# Patient Record
Sex: Female | Born: 1950 | Race: White | Hispanic: No | Smoking: Former smoker
Health system: Southern US, Community
[De-identification: ages and names within clinical notes are randomized; demographics above are authoritative.]

## PROBLEM LIST (undated history)

## (undated) DIAGNOSIS — J449 Chronic obstructive pulmonary disease, unspecified: Secondary | ICD-10-CM

## (undated) HISTORY — DX: Chronic obstructive pulmonary disease, unspecified: J44.9

## (undated) HISTORY — PX: TONSILLECTOMY: SUR1361

## (undated) HISTORY — PX: ABDOMINAL HYSTERECTOMY: SHX81

---

## 2021-06-19 LAB — CT OUTSIDE FILMS BODY

## 2021-06-24 ENCOUNTER — Encounter (INDEPENDENT_AMBULATORY_CARE_PROVIDER_SITE_OTHER): Payer: Self-pay | Admitting: *Deleted

## 2021-07-22 ENCOUNTER — Telehealth: Payer: Self-pay | Admitting: Internal Medicine

## 2021-07-22 NOTE — Telephone Encounter (Signed)
Patient scheduled pulmonary consult with Dr. Celine Mans 07/30/21. Papers were received and state Patient had cxr 07/14/21 and CTA 06/30/21 in [place], [place]. No images in PACS.   ATC patient as consult reminder and requested Patient contact location CT/cxr completed and bring CD with her to OV for Dr. Celine Mans to review.  LM to call back.

## 2021-07-24 NOTE — Telephone Encounter (Signed)
Labs requested from Alfonso Ramus, NP. My call was transferred to radiology for CD.  I requested chest CT and images are being transferred to [facility] PACS per April.

## 2021-07-24 NOTE — Telephone Encounter (Signed)
Called and spoke with Patient.  Patient stated she had CT with contrast of her lungs 06/30/21 in [place].  Patient stated she is seen by Alfonso Ramus, NP and Revonda Standard has copy of CD and records. I called the office of Alfonso Ramus, NP [place], [place] [phone].

## 2021-07-27 NOTE — Telephone Encounter (Signed)
CTA 06/30/21 has been uploaded and is available in PACS.

## 2021-07-30 ENCOUNTER — Encounter: Payer: Self-pay | Admitting: Internal Medicine

## 2021-07-30 ENCOUNTER — Other Ambulatory Visit: Payer: Self-pay

## 2021-07-30 ENCOUNTER — Ambulatory Visit (INDEPENDENT_AMBULATORY_CARE_PROVIDER_SITE_OTHER): Payer: Medicare Other | Admitting: Internal Medicine

## 2021-07-30 VITALS — BP 120/78 | HR 78 | Temp 98.5°F | Ht 65.0 in | Wt 153.0 lb

## 2021-07-30 DIAGNOSIS — R0602 Shortness of breath: Secondary | ICD-10-CM

## 2021-07-30 MED ORDER — ANORO ELLIPTA 62.5-25 MCG/ACT IN AEPB: 1.0000 | INHALATION_SPRAY | Freq: Every day | RESPIRATORY_TRACT | 11 refills | Status: DC

## 2021-07-30 MED ORDER — ALBUTEROL SULFATE HFA 108 (90 BASE) MCG/ACT IN AERS: 2.0000 | INHALATION_SPRAY | Freq: Four times a day (QID) | RESPIRATORY_TRACT | 5 refills | Status: DC | PRN

## 2021-07-30 NOTE — Progress Notes (Signed)
The patient has been prescribed the inhaler albuterol and anoro. Inhaler technique was demonstrated to patient. The patient subsequently demonstrated correct technique.

## 2021-07-30 NOTE — Progress Notes (Signed)
Rachael Mullins    [id number]    15-Jul-1951  Primary Care Physician:Stanfield, Bryson Ha, FNP  Referring Physician: Vanessa [place], FNP [address] [place],  [place] [address] Reason for Consultation: shortness of breath  Date of Consultation: 07/30/2021  Chief complaint:   Chief Complaint  Patient presents with   Consult    Patient reports she has some shortness of breath with exertion.      HPI: Had covid in the end of May. About 10 days later had worsening shortness of breath and went to the ED found to have multiple pulmonary emboli. Was admitted and started on eliquis. Symptoms of dyspnea improved. After three months she saw her doctor and was taken off eliquis. She has had persistent fatigue, no energy. She went back to her doctor and they did a repeat CT scan and saw that there was recurrent clot. She also had a contrast reaction. She was put back on eliquis.   Having lower extremity edema in the right lower extremity -  She had an ultrasound of lower extremity - negative for DVT.  She has seen heart and vascular doctor and they are doing an ischemic work up.   She feels that her breathing is much improved. She sleeps well at night. She does have some difficulty with taking steps and exerting herself.   She denies pain. Wheezing, coughing.    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  Exposures: lives at home independently, able to complete ADLs.  Smoking history: 50 pack years x 1/2 ppd = 25 pack years. Quit after covid.   Social History   Occupational History   Not on file  Tobacco Use   Smoking status: Former    Packs/day: 0.50    Types: Cigarettes    Start date: 09/13/1966    Quit date: 03/13/2021    Years since quitting: 0.3   Smokeless tobacco: Never  Substance and Sexual Activity   Alcohol use: Never   Drug use: Never   Sexual activity: Not Currently    Relevant family history:  Family  History  Problem Relation Age of Onset   Asthma Maternal Grandmother    Diabetes Maternal Grandmother    Asthma Paternal Grandmother    Alpha-1 antitrypsin deficiency Neg Hx    Tuberous sclerosis Neg Hx    Lung disease Neg Hx     History reviewed. No pertinent past medical history.  Past Surgical History:  Procedure Laterality Date   ABDOMINAL HYSTERECTOMY     TONSILLECTOMY       Physical Exam: Blood pressure 120/78, pulse 78, temperature 98.5 F (36.9 C), temperature source Oral, height 5\' 5"  (1.651 m), weight 153 lb (69.4 kg), SpO2 95 %. Gen:      No acute distress ENT:  no nasal polyps, mucus membranes moist Lungs:    diminished, No increased respiratory effort, symmetric chest wall excursion, clear to auscultation bilaterally, no wheezes or crackles CV:         Regular rate and rhythm; no murmurs, rubs, or gallops.  No pedal edema Abd:      + bowel sounds; soft, non-tender; no distension MSK: no acute synovitis of DIP or PIP joints, no mechanics hands.  Skin:      Warm and dry; no rashes Neuro: normal speech, no focal facial asymmetry Psych: alert and oriented x3, normal mood and affect   Data Reviewed/Medical Decision Making:  Independent interpretation of tests: Imaging: Unable to view CTPE study in PACS - will contact for study  PFTs:  No flowsheet data found.  Labs: CBC done 07/14/21 shows elevated WBC at 11, Hgb 13, no eosinophils Cr 0.9 K 3.8 Cl 101, CP2 30 Glucose 113 BNP 101 not elevated Total protein 5.9 Urine cx no growth to date.   Immunization status:   There is no immunization history on file for this patient.   I reviewed prior external note(s) from PCP, ED visits  I reviewed the result(s) of the labs and imaging as noted above.   I have ordered  PFT  Assessment:  Shortness of breath Covid 19 infection Acute PE, provoked in the setting of covid  Plan/Recommendations: Like new diagnosis COPD.  Start anoro 1 puff once a day. Start  albuterol prn. Will obtain PFTs. Continue eliquis for now. I would not think she needs more than 6 months of AC.this is likely still the provoked PE which is old and slowly resolving from Covid. I think her symptoms are more likely from smoking related lung disease - COPD.  I explained we do not routinely repeat CT scans for PE resolution. She has already had a contrast reaction.  Will obtain records from her previous imaging.   We discussed disease management and progression at length today.      Return to Care: Return in about 2 months (around 09/29/2021).  Durel Salts, MD Pulmonary and Critical Care Medicine [facility] [phone]  CC: Vanessa Pease, FNP

## 2021-07-30 NOTE — Patient Instructions (Addendum)
Please schedule follow up scheduled with myself in 2 months.    Before your next visit I would like you to have: Full set of PFTs - 1 hour, appointment with me after that  Start anoro inhaler 1 puff once a day.  Take the albuterol rescue inhaler every 4 to 6 hours as needed for wheezing or shortness of breath. You can also take it 15 minutes before exercise or exertional activity. Side effects include heart racing or pounding, jitters or anxiety. If you have a history of an irregular heart rhythm, it can make this worse. Can also give some patients a hard time sleeping.  To inhale the aerosol using an inhaler, follow these steps:  Remove the protective dust cap from the end of the mouthpiece. If the dust cap was not placed on the mouthpiece, check the mouthpiece for dirt or other objects. Be sure that the canister is fully and firmly inserted in the mouthpiece. 2. If you are using the inhaler for the first time or if you have not used the inhaler in more than 14 days, you will need to prime it. You may also need to prime the inhaler if it has been dropped. Ask your pharmacist or check the manufacturer's information if this happens. To prime the inhaler, shake it well and then press down on the canister 4 times to release 4 sprays into the air, away from your face. Be careful not to get albuterol in your eyes. 3. Shake the inhaler well. 4. Breathe out as completely as possible through your mouth. 4. Hold the canister with the mouthpiece on the bottom, facing you and the canister pointing upward. Place the open end of the mouthpiece into your mouth. Close your lips tightly around the mouthpiece. 6. Breathe in slowly and deeply through the mouthpiece.At the same time, press down once on the container to spray the medication into your mouth. 7. Try to hold your breath for 10 seconds. remove the inhaler, and breathe out slowly. 8. If you were told to use 2 puffs, wait 1 minute and then repeat steps  3-7. 9. Replace the protective cap on the inhaler. 10. Clean your inhaler regularly. Follow the manufacturer's directions carefully and ask your doctor or pharmacist if you have any questions about cleaning your inhaler.  Check the back of the inhaler to keep track of the total number of doses left on the inhaler.

## 2021-08-10 ENCOUNTER — Ambulatory Visit (INDEPENDENT_AMBULATORY_CARE_PROVIDER_SITE_OTHER): Payer: Medicare Other | Admitting: Gastroenterology

## 2021-08-10 ENCOUNTER — Other Ambulatory Visit: Payer: Self-pay

## 2021-08-10 ENCOUNTER — Encounter (INDEPENDENT_AMBULATORY_CARE_PROVIDER_SITE_OTHER): Payer: Self-pay | Admitting: Gastroenterology

## 2021-08-10 VITALS — BP 128/83 | HR 79 | Temp 99.3°F | Ht 65.0 in | Wt 156.8 lb

## 2021-08-10 DIAGNOSIS — K639 Disease of intestine, unspecified: Secondary | ICD-10-CM | POA: Diagnosis not present

## 2021-08-10 DIAGNOSIS — K862 Cyst of pancreas: Secondary | ICD-10-CM

## 2021-08-10 NOTE — Patient Instructions (Signed)
We will obtain most recent labs from Dr. Octaviano Glow, if he did not check your liver function, I will be in touch with you to update these.  We will get you scheduled for colonoscopy for further evaluation of the lesion in your colon.  Please obtain the CD with CT abdomen images that you had last month so that we can take a look at these and further evaluate the cyst that was seen on your pancreas, further recommendations to follow once these images are reviewed.

## 2021-08-10 NOTE — Progress Notes (Signed)
Referring Provider: Vanessa Sidney, FNP Primary Care Physician:  Vanessa [place], FNP Primary GI Physician: newly established  Chief Complaint  Patient presents with   cyst on pancreas    Pt arrives to discuss cyst on pancreas   HPI:   Rachael Mullins is a 70 y.o. female with past medical history of pulmonary Embolism on eliquis, vitamin D Deficiency.   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  Patient states that she went to the ER at the end of May with COVID, she went back a few weeks later with worsening SOB and was found to have PEs, she was started on Eliquis at that time, she was informed later on that she had an incidental finding of pancreatic cyst, she then saw her PCP and had repeat CT A/P for further evaluation of the cyst and was referred to GI for further evaluation.   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 There was also 67mm oval shaped hypodensity adjacent to the hepatic flexure of the colon, also unchanged from prior study. I am not able to review any images from this scan. Liver function WNL in June, she denies pruritus or jaundice. She had labs done with Dr. Octaviano Glow a few weeks ago, we will try to obtain these records.   She denies any history of pancreatitis, no issues with with nausea or vomiting, no diarrhea or abdominal pain. She has occasional constipation that she takes senokot as needed which seems to help, also eats a diet high in fruits and vegetables. She has had no changes in stool consistency or bowel habits. No rectal bleeding or melena.   Social hx: no etoh, previous smoker up until June. Fam hx:no CRC, liver disease  Last Colonoscopy:at age 56, 2003, Dr. Allena Katz, normal per patient Last Endoscopy:n/a  Recommendations:  Diagnostic colonoscopy for lesion of colon  History reviewed. No pertinent  past medical history.  Past Surgical History:  Procedure Laterality Date   ABDOMINAL HYSTERECTOMY     TONSILLECTOMY      Current Outpatient Medications  Medication Sig Dispense Refill   calcium carbonate (OSCAL) 1500 (600 Ca) MG TABS tablet Take 600 mg of elemental calcium by mouth 2 (two) times daily with a meal. With vit d3 400 iu     diclofenac (VOLTAREN) 50 MG EC tablet Take 50 mg by mouth 2 (two) times daily.     ELIQUIS DVT/PE STARTER PACK 5 mg See admin instructions. One bid     furosemide (LASIX) 20 MG tablet Take 20 mg by mouth daily.     OVER THE COUNTER MEDICATION Vit d 3 2,000 iu daily     zinc gluconate 50 MG tablet Take 50 mg by mouth daily.     albuterol (VENTOLIN HFA) 108 (90 Base) MCG/ACT inhaler Inhale 2 puffs into the lungs every 6 (six) hours as needed. 18 g 5   umeclidinium-vilanterol (ANORO ELLIPTA) 62.5-25 MCG/ACT AEPB Inhale 1 puff into the lungs daily. (Patient not taking: Reported on 08/10/2021) 1 each 11   No current facility-administered medications for this visit.    Allergies as of 08/10/2021 - Review Complete 08/10/2021  Allergen Reaction Noted   Ivp dye [iodinated diagnostic agents] Hives 07/30/2021   Codeine Nausea Only 07/30/2021   Nitrofurantoin Other (See Comments) 07/30/2021    Family History  Problem Relation Age of Onset   Asthma Maternal Grandmother  Diabetes Maternal Grandmother    Asthma Paternal Grandmother    Alpha-1 antitrypsin deficiency Neg Hx    Tuberous sclerosis Neg Hx    Lung disease Neg Hx     Social History   Socioeconomic History   Marital status: Widowed    Spouse name: Not on file   Number of children: Not on file   Years of education: Not on file   Highest education level: Not on file  Occupational History   Not on file  Tobacco Use   Smoking status: Former    Packs/day: 0.50    Types: Cigarettes    Start date: 09/13/1966    Quit date: 03/13/2021    Years since quitting: 0.4   Smokeless tobacco: Never   Substance and Sexual Activity   Alcohol use: Never   Drug use: Never   Sexual activity: Not Currently  Other Topics Concern   Not on file  Social History Narrative   Not on file   Social Determinants of Health   Financial Resource Strain: Not on file  Food Insecurity: Not on file  Transportation Needs: Not on file  Physical Activity: Not on file  Stress: Not on file  Social Connections: Not on file   Review of systems General: negative for malaise, night sweats, fever, chills, weight loss Neck: Negative for lumps, goiter, pain and significant neck swelling Resp: Negative for cough, wheezing, dyspnea at rest CV: Negative for chest pain, palpitations, orthopnea +non pitting edema to bilateral ankles, R worse than L. GI: denies melena, hematochezia, nausea, vomiting, diarrhea,  dysphagia, odyonophagia, early satiety or unintentional weight loss. +occasional constipation MSK: Negative for joint pain or swelling, back pain, and muscle pain. Derm: Negative for itching or rash Psych: Denies depression, anxiety, memory loss, confusion. No homicidal or suicidal ideation.  Heme: Negative for prolonged bleeding and swollen nodes. +easy bruising (on eliquis) Endocrine: Negative for cold or heat intolerance, polyuria, polydipsia and goiter. Neuro: negative for tremor, gait imbalance, syncope and seizures. The remainder of the review of systems is noncontributory.  Physical Exam: BP (!) 160/76 (BP Location: Right Arm, Patient Position: Sitting, Cuff Size: Large)   Pulse 79   Temp 99.3 F (37.4 C) (Oral)   Ht 5\' 5"  (1.651 m)   Wt 156 lb 12.8 oz (71.1 kg)   BMI 26.09 kg/m  General:   Alert and oriented. No distress noted. Pleasant and cooperative.  Head:  Normocephalic and atraumatic. Eyes:  Conjuctiva clear without scleral icterus. Mouth:  Oral mucosa pink and moist. Good dentition. No lesions. Heart: Normal rate and rhythm, s1 and s2 heart sounds present.  Lungs: Clear lung sounds  in all lobes. Respirations equal and unlabored. Abdomen:  +BS, soft, non-tender and non-distended. No rebound or guarding. No HSM or masses noted. Derm: No palmar erythema or jaundice Msk:  Symmetrical without gross deformities. Normal posture. Extremities:  Without edema. Neurologic:  Alert and  oriented x4 Psych:  Alert and cooperative. Normal mood and affect.  Invalid input(s): 6 MONTHS   ASSESSMENT: Rachael Mullins is a 70 y.o. female presenting today for further evaluation of pancreatic cyst noted on CT A/P.  5mm hypodense lesion within distal pancreatic tail, as noted in CT A/P report from 06/19/21. Patient has no hx of pancreatitis, no CBD dilation noted on imaging report. Last LFTs in June WNL, we will obtain most recent labs. Unable to review actual images from CT scan, patient will obtain CD with CT images for [place] to review, further recommendations to  follow.   There was also noted of 73mm oval-shaped hypodensity seen adjacent to hepatic flexure of colon on recent CT A/P in October, last colonoscopy was around 2003. Patient has had no changes in stool consistency or bowel habits, no rectal bleeding, weight loss or abdominal pain. We will schedule colonoscopy for further evaluation of lesion to ensure no underlying malignancy is present.   No red flag symptoms. Patient denies melena, hematochezia, nausea, vomiting, diarrhea, dysphagia, odyonophagia, early satiety or weight loss.   Indications, risks and benefits of procedure discussed in detail with patient. Patient verbalized understanding and is in agreement to proceed with colonoscopy at this time.   PLAN:  Patient to obtain CD with CT images for further review-further recommendations to follow after images reviewed 2. Will obtain labs from Dr. Scarlette Shorts, may need to check LFTs if these were not done 3. Schedule colonoscopy   Follow Up: TBD  Francie Keeling L. Alver Sorrow, MSN, APRN, AGNP-C Adult-Gerontology Nurse Practitioner [facility]

## 2021-08-11 ENCOUNTER — Telehealth (INDEPENDENT_AMBULATORY_CARE_PROVIDER_SITE_OTHER): Payer: Self-pay | Admitting: Gastroenterology

## 2021-08-12 NOTE — Telephone Encounter (Signed)
CT A/P images of pancreatic lesion were reviewed by Dr. Levon Hedger and radiologist on 08/11/21. Based on guidelines and imaging, as well as HPI, it was recommended that patient have MRCP W and W/O contrast two years from initial imaging (around 06/20/23) as lesion is likely a simple cyst. Patient has no weight loss, changes in appetite, nausea, vomiting, or new onset DM. I discussed this in depth with patient, all questions were answered. Patient verbalized understanding. She will let me know if she develops any new symptoms.

## 2021-08-14 ENCOUNTER — Encounter (INDEPENDENT_AMBULATORY_CARE_PROVIDER_SITE_OTHER): Payer: Self-pay

## 2021-09-18 ENCOUNTER — Encounter (INDEPENDENT_AMBULATORY_CARE_PROVIDER_SITE_OTHER): Payer: Self-pay

## 2021-10-08 ENCOUNTER — Encounter: Payer: Self-pay | Admitting: Internal Medicine

## 2021-10-08 ENCOUNTER — Ambulatory Visit: Payer: Medicare Other | Admitting: Internal Medicine

## 2021-10-08 ENCOUNTER — Ambulatory Visit (INDEPENDENT_AMBULATORY_CARE_PROVIDER_SITE_OTHER): Payer: Medicare Other | Admitting: Internal Medicine

## 2021-10-08 ENCOUNTER — Other Ambulatory Visit: Payer: Self-pay

## 2021-10-08 VITALS — BP 120/80 | HR 82 | Temp 97.7°F | Ht 64.0 in | Wt 157.6 lb

## 2021-10-08 DIAGNOSIS — J449 Chronic obstructive pulmonary disease, unspecified: Secondary | ICD-10-CM

## 2021-10-08 DIAGNOSIS — I2699 Other pulmonary embolism without acute cor pulmonale: Secondary | ICD-10-CM

## 2021-10-08 DIAGNOSIS — U071 COVID-19: Secondary | ICD-10-CM

## 2021-10-08 DIAGNOSIS — R0602 Shortness of breath: Secondary | ICD-10-CM | POA: Diagnosis not present

## 2021-10-08 LAB — PULMONARY FUNCTION TEST
DL/VA % pred: 102 %
DL/VA: 4.23 ml/min/mmHg/L
DLCO cor % pred: 90 %
DLCO cor: 17.5 ml/min/mmHg
DLCO unc % pred: 90 %
DLCO unc: 17.5 ml/min/mmHg
FEF 25-75 Post: 1.02 L/sec
FEF 25-75 Pre: 1.25 L/sec
FEF2575-%Change-Post: -18 %
FEF2575-%Pred-Post: 56 %
FEF2575-%Pred-Pre: 69 %
FEV1-%Change-Post: -4 %
FEV1-%Pred-Post: 88 %
FEV1-%Pred-Pre: 92 %
FEV1-Post: 1.96 L
FEV1-Pre: 2.05 L
FEV1FVC-%Change-Post: -2 %
FEV1FVC-%Pred-Pre: 91 %
FEV6-%Change-Post: -2 %
FEV6-%Pred-Post: 102 %
FEV6-%Pred-Pre: 104 %
FEV6-Post: 2.86 L
FEV6-Pre: 2.93 L
FEV6FVC-%Change-Post: 0 %
FEV6FVC-%Pred-Post: 103 %
FEV6FVC-%Pred-Pre: 103 %
FVC-%Change-Post: -2 %
FVC-%Pred-Post: 99 %
FVC-%Pred-Pre: 101 %
FVC-Post: 2.9 L
FVC-Pre: 2.97 L
Post FEV1/FVC ratio: 67 %
Post FEV6/FVC ratio: 99 %
Pre FEV1/FVC ratio: 69 %
Pre FEV6/FVC Ratio: 99 %
RV % pred: 133 %
RV: 2.97 L
TLC % pred: 115 %
TLC: 5.85 L

## 2021-10-08 NOTE — Progress Notes (Signed)
PFT done today. 

## 2021-10-08 NOTE — Patient Instructions (Addendum)
Please schedule follow up scheduled with myself in 12 months.  If my schedule is not open yet, we will contact you with a reminder closer to that time. Please call [phone] if you haven't heard from [place] a month before.   Keep taking albuterol. Ok to stop anoro.

## 2021-10-08 NOTE — Progress Notes (Signed)
Rachael Mullins    [id number]    05-12-1951  Primary Care Physician:Stanfield, Bryson Ha, FNP Date of Appointment: 10/08/2021 Established Patient Visit  Chief complaint:   Chief Complaint  Patient presents with   Follow-up    Patient reports that she is doing well with her breathing and short of breath with exertion at times,      HPI: Rachael Mullins is a 71 y.o. woman with past medical history of tobacco use disorder and covid infection with provoked PE.   Interval Updates: Feeling better with anoro. Not had any need for albuterol prn. She was wondering if she even needs the anoro due to not needing it much.   Activity level is better. She is working part time at radio station, going to [employer].   She feels she is gradually improving. Sleeping well. Eating well.   She is taking lasix for some leg swelling.   No prior history of PE. No family history of bleeding/clotting.   I have reviewed the patient's family social and past medical history and updated as appropriate.   Past Medical History:  Diagnosis Date   COPD (chronic obstructive pulmonary disease) (HCC)     Past Surgical History:  Procedure Laterality Date   ABDOMINAL HYSTERECTOMY     TONSILLECTOMY      Family History  Problem Relation Age of Onset   Asthma Maternal Grandmother    Diabetes Maternal Grandmother    Asthma Paternal Grandmother    Alpha-1 antitrypsin deficiency Neg Hx    Tuberous sclerosis Neg Hx    Lung disease Neg Hx     Social History   Occupational History   Not on file  Tobacco Use   Smoking status: Former    Packs/day: 0.50    Types: Cigarettes    Start date: 09/13/1966    Quit date: 03/13/2021    Years since quitting: 0.5   Smokeless tobacco: Never  Substance and Sexual Activity   Alcohol use: Never   Drug use: Never   Sexual activity: Not Currently     Physical Exam: Blood pressure 120/80, pulse 82, temperature 97.7 F (36.5 C), temperature source Oral,  height 5\' 4"  (1.626 m), weight 157 lb 9.6 oz (71.5 kg), SpO2 95 %.  Gen:      No acute distress ENT:  no nasal polyps, mucus membranes moist Lungs:    No increased respiratory effort, symmetric chest wall excursion, clear to auscultation bilaterally, no wheezes or crackles CV:         Regular rate and rhythm; no murmurs, rubs, or gallops.  No pedal edema   Data Reviewed: Imaging:  PFTs:  PFT Results Latest Ref Rng & Units 10/08/2021  FVC-Pre L 2.97  FVC-Predicted Pre % 101  FVC-Post L 2.90  FVC-Predicted Post % 99  Pre FEV1/FVC % % 69  Post FEV1/FCV % % 67  FEV1-Pre L 2.05  FEV1-Predicted Pre % 92  FEV1-Post L 1.96  DLCO uncorrected ml/min/mmHg 17.50  DLCO UNC% % 90  DLCO corrected ml/min/mmHg 17.50  DLCO COR %Predicted % 90  DLVA Predicted % 102  TLC L 5.85  TLC % Predicted % 115  RV % Predicted % 133   I have personally reviewed the patient's PFTs and very mild COPD FEV1 92% of predicted.   Labs:  Immunization status:  There is no immunization history on file for this patient.  External Records Personally Reviewed: cardiology  Assessment:  Mild COPD  FEV 1 92% of predicted Acute pulmonary embolism provoked from Covid 19 infection  Plan/Recommendations: Discussed risks and benefits at length. I recommend stopping Eliquis given this was likely provoked in the setting of covid infection and she has received over 6 months of AC.  Stop anoro, ok to continue prn albuterol.  Continue to abstain from smoking.    Return to Care: Return in about 1 year (around 10/08/2022).   Durel Salts, MD Pulmonary and Critical Care Medicine [facility] Office:[phone]

## 2021-10-19 NOTE — Progress Notes (Signed)
[place] [address] [place] [address]   CLINIC:  Medical Oncology/Hematology  CONSULT NOTE  Patient Care Team: Frances Maywood, FNP as PCP - General (Family Medicine)  CHIEF COMPLAINTS/PURPOSE OF CONSULTATION:  Pulmonary embolism  HISTORY OF PRESENTING ILLNESS:  Rachael Mullins 71 y.o. female is at the request of her primary care provider (NP Henry Russel) for evaluation of pulmonary embolism.  Per note sent by PCP, patient was diagnosed with bilateral pulmonary embolisms with associated right ventricular strain on 02/19/2021 thought to be provoked by COVID-19 and admission to [facility] in May 2022.  She took Eliquis for 3 months, and discontinued it in September 2022, as instructed by her hematologist at the time.  Patient did admit to not taking her Eliquis as prescribed, and for several weeks she only took the medication once a day rather than twice daily.  She was seen by her PCP on 06/30/2021 for dyspnea on exertion, and CTA chest at that time showed redemonstration of bilateral PEs.  Venous duplex was negative for DVTs.  At that time, she was resumed on Eliquis.  (She was briefly on Xarelto due to concern for possible reaction to Eliquis, but this was later felt to be a reaction to IV contrast rather than to Eliquis.  She has not had any issues with Eliquis after being restarted on it.)  She has continued to complain of dyspnea on exertion, and was referred to [facility] pulmonology, most recently seen by Dr. Shearon Stalls on 10/08/2021.  She has been diagnosed with COPD.  She is a former smoker.  Per note by Dr. Shearon Stalls, PE thought to be provoked by COVID-19 infection.  She is currently taking Eliquis as prescribed, 5 mg twice daily.  She has some easy bruising but denies any bleeding such as epistaxis, hematemesis, hematochezia, or melena.  She complains of bilateral ankle swelling, right greater than left, with recent vascular studies showing venous  insufficiency also right greater than left.  She continues to have some dyspnea on exertion, but this is getting better.  No shortness of breath at rest, chest pain, cough, hemoptysis, or palpitations.  She denies any previous DVT or PE before May 2022.  She has not had any miscarriages.  She denies any family history of DVT, PE, or coagulopathy.  No family history of miscarriages that she is aware of.  Her PMH is significant for mild COPD and venous insufficiency.  She is a former smoker, and smoked about 1/4 pack/day for 50 years, quit after her COVID infection in May 2022.  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  She denies any history of alcohol or illicit drug use.  Family history is unremarkable per her report.   MEDICAL HISTORY:  Past Medical History:  Diagnosis Date   COPD (chronic obstructive pulmonary disease) ([place])     SURGICAL HISTORY: Past Surgical History:  Procedure Laterality Date   ABDOMINAL HYSTERECTOMY     TONSILLECTOMY      SOCIAL HISTORY: Social History   Socioeconomic History   Marital status: Widowed    Spouse name: Not on file   Number of children: Not on file   Years of education: Not on file   Highest education level: Not on file  Occupational History   Not on file  Tobacco Use   Smoking status: Former    Packs/day: 0.50  Types: Cigarettes    Start date: 09/13/1966    Quit date: 03/13/2021    Years since quitting: 0.6   Smokeless tobacco: Never  Substance and Sexual Activity   Alcohol use: Never   Drug use: Never   Sexual activity: Not Currently  Other Topics Concern   Not on file  Social History Narrative   Not on file   Social Determinants of Health   Financial Resource Strain: Not on file  Food Insecurity: Not on file  Transportation Needs: Not on file  Physical Activity: Not on file  Stress: Not on file  Social Connections: Not on  file  Intimate Partner Violence: Not on file    FAMILY HISTORY: Family History  Problem Relation Age of Onset   Asthma Maternal Grandmother    Diabetes Maternal Grandmother    Asthma Paternal Grandmother    Alpha-1 antitrypsin deficiency Neg Hx    Tuberous sclerosis Neg Hx    Lung disease Neg Hx     ALLERGIES:  is allergic to ivp dye [iodinated contrast media], codeine, and nitrofurantoin.  MEDICATIONS:  Current Outpatient Medications  Medication Sig Dispense Refill   albuterol (VENTOLIN HFA) 108 (90 Base) MCG/ACT inhaler Inhale 2 puffs into the lungs every 6 (six) hours as needed. 18 g 5   diclofenac (VOLTAREN) 50 MG EC tablet Take 50 mg by mouth 2 (two) times daily.     ELIQUIS DVT/PE STARTER PACK 5 mg See admin instructions. One bid     furosemide (LASIX) 20 MG tablet Take 20 mg by mouth daily.     NON FORMULARY Take 2 tablets by mouth 2 (two) times daily. Takes Vit D, Zinc and Calcium together.     OVER THE COUNTER MEDICATION Vit d 3 2,000 iu daily     umeclidinium-vilanterol (ANORO ELLIPTA) 62.5-25 MCG/ACT AEPB Inhale 1 puff into the lungs daily. 1 each 11   No current facility-administered medications for this visit.    REVIEW OF SYSTEMS:   Review of Systems  Constitutional:  Positive for fatigue (mild, energy 70%). Negative for appetite change, chills, diaphoresis, fever and unexpected weight change.  HENT:   Negative for lump/mass and nosebleeds.   Eyes:  Negative for eye problems.  Respiratory:  Positive for shortness of breath (with exertion). Negative for cough and hemoptysis.   Cardiovascular:  Positive for leg swelling (ankles). Negative for chest pain and palpitations.  Gastrointestinal:  Negative for abdominal pain, blood in stool, constipation, diarrhea, nausea and vomiting.  Genitourinary:  Negative for hematuria.   Skin: Negative.   Neurological:  Negative for dizziness, headaches and light-headedness.  Hematological:  Does not bruise/bleed easily.      PHYSICAL EXAMINATION: ECOG PERFORMANCE STATUS: 1 - Symptomatic but completely ambulatory  There were no vitals filed for this visit. There were no vitals filed for this visit.  Physical Exam Constitutional:      Appearance: Normal appearance.  HENT:     Head: Normocephalic and atraumatic.     Mouth/Throat:     Mouth: Mucous membranes are moist.  Eyes:     Extraocular Movements: Extraocular movements intact.     Pupils: Pupils are equal, round, and reactive to light.  Cardiovascular:     Rate and Rhythm: Normal rate and regular rhythm.     Pulses: Normal pulses.     Heart sounds: Normal heart sounds.  Pulmonary:     Effort: Pulmonary effort is normal.     Breath sounds: Normal breath sounds.  Abdominal:  General: Bowel sounds are normal.     Palpations: Abdomen is soft.     Tenderness: There is no abdominal tenderness.  Musculoskeletal:        General: No swelling.     Right lower leg: Edema (1+ ankle edema) present.     Left lower leg: Edema (trace ankle edema) present.  Lymphadenopathy:     Cervical: No cervical adenopathy.  Skin:    General: Skin is warm and dry.  Neurological:     General: No focal deficit present.     Mental Status: She is alert and oriented to person, place, and time.  Psychiatric:        Mood and Affect: Mood normal.        Behavior: Behavior normal.      LABORATORY DATA:  I have reviewed the data as listed Recent Results (from the past 2160 hour(s))  Pulmonary function test     Status: None   Collection Time: 10/08/21 12:54 PM  Result Value Ref Range   FVC-Pre 2.97 L   FVC-%Pred-Pre 101 %   FVC-Post 2.90 L   FVC-%Pred-Post 99 %   FVC-%Change-Post -2 %   FEV1-Pre 2.05 L   FEV1-%Pred-Pre 92 %   FEV1-Post 1.96 L   FEV1-%Pred-Post 88 %   FEV1-%Change-Post -4 %   FEV6-Pre 2.93 L   FEV6-%Pred-Pre 104 %   FEV6-Post 2.86 L   FEV6-%Pred-Post 102 %   FEV6-%Change-Post -2 %   Pre FEV1/FVC ratio 69 %   FEV1FVC-%Pred-Pre 91 %    Post FEV1/FVC ratio 67 %   FEV1FVC-%Change-Post -2 %   Pre FEV6/FVC Ratio 99 %   FEV6FVC-%Pred-Pre 103 %   Post FEV6/FVC ratio 99 %   FEV6FVC-%Pred-Post 103 %   FEV6FVC-%Change-Post 0 %   FEF 25-75 Pre 1.25 L/sec   FEF2575-%Pred-Pre 69 %   FEF 25-75 Post 1.02 L/sec   FEF2575-%Pred-Post 56 %   FEF2575-%Change-Post -18 %   RV 2.97 L   RV % pred 133 %   TLC 5.85 L   TLC % pred 115 %   DLCO unc 17.50 ml/min/mmHg   DLCO unc % pred 90 %   DLCO cor 17.50 ml/min/mmHg   DLCO cor % pred 90 %   DL/VA 4.23 ml/min/mmHg/L   DL/VA % pred 102 %    RADIOGRAPHIC STUDIES: I have personally reviewed the radiological images as listed and agreed with the findings in the report. No results found.  ASSESSMENT & PLAN: 1.  Pulmonary embolism, bilateral - Seen at the request of her primary care provider (NP Henry Russel) for evaluation of pulmonary embolism. - Patient was diagnosed with bilateral PEs and associated right ventricular heart strain on 02/19/2021, thought to be provoked by COVID-19 (May 2022); admitted to [facility] in May 2022. - She took Eliquis for 3 months, and discontinued it in September 2022, but admitted to not taking her Eliquis as prescribed - for several weeks she only took the medication once a day rather than twice daily. - She was seen by her PCP on 06/30/2021 for DOE, and CTA chest at that time showed redemonstration of bilateral PEs in the same distribution area as original PEs.  Venous duplex was negative for DVTs.  At that time, she was resumed on Eliquis. - She was briefly on Xarelto due to concern for possible reaction to Eliquis, but this was later felt to be a reaction to IV contrast rather than to Eliquis.  She has not had any  issues with Eliquis after being restarted on it. - She is taking Eliquis as prescribed.  She has not had any major bleeding events. - She has some mild residual dyspnea on exertion - No previous DVT or PE.  No personal or family history  of miscarriages.  No family history of coagulopathy. - Since CTA on 06/30/2021 showed redistribution of PE in same pattern as first PE, differential diagnosis favors residual rather than recurrent PE.  Thus, it is important that we ensure PE has fully resolved before taking patient off of Eliquis. - PLAN: Patient instructed to continue Eliquis 5 mg twice daily. - We will check CTA chest (with steroid + Benadryl premedication due to possible allergy to iodine contrast) - We will check preliminary coagulopathy panel today. - RTC in 1 month to discuss results and next steps  2.  Other history - PMH: COPD, venous insufficiency - SOCIAL: Former smoker (0.25 PPD x50 years).  No alcohol or illicit drugs.  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 - FAMILY: Unremarkable per patient report.   All questions were answered. The patient knows to call the clinic with any problems, questions or concerns.   Medical decision making: Moderate  Time spent on visit: I spent 25 minutes counseling the patient face to face. The total time spent in the appointment was 40 minutes and more than 50% was on counseling.  I, Tarri Abernethy PA-C, have seen this patient in conjunction with Dr. Derek Jack.  Greater than 50% of visit was performed by Dr. Delton Coombes.    Harriett Rush, PA-C 10/20/2021 5:50 PM  DR.  Mauri Temkin: I have independently evaluated this patient and formulated my assessment and plan.  I agree with HPI and assessment plan written by Casey Burkitt, PA-C.  She had initial bilateral PE on 02/19/2021 provoked by COVID-19.  She took Eliquis for 3 months and stopped it.  CT angiogram on 06/30/2021 showed pulmonary embolism in similar distribution.  She was started back on Eliquis without any major problems.  I think the second episode was not a new pulmonary embolism, but suboptimally treated first episode on 02/19/2021.  Hence I believe that limited duration anticoagulation is sufficient  in this patient.  However we would like to have a negative CT angiogram and normal D-dimer prior to discontinuation of anticoagulation at this time.

## 2021-10-20 ENCOUNTER — Encounter (HOSPITAL_COMMUNITY): Payer: Self-pay | Admitting: Hematology

## 2021-10-20 ENCOUNTER — Inpatient Hospital Stay (HOSPITAL_COMMUNITY): Payer: Medicare Other | Attending: Hematology | Admitting: Hematology

## 2021-10-20 ENCOUNTER — Other Ambulatory Visit: Payer: Self-pay

## 2021-10-20 ENCOUNTER — Inpatient Hospital Stay (HOSPITAL_COMMUNITY): Payer: Medicare Other

## 2021-10-20 VITALS — BP 132/84 | HR 72 | Temp 98.6°F | Resp 18 | Ht 64.0 in | Wt 156.6 lb

## 2021-10-20 DIAGNOSIS — Z7901 Long term (current) use of anticoagulants: Secondary | ICD-10-CM

## 2021-10-20 DIAGNOSIS — Z86711 Personal history of pulmonary embolism: Secondary | ICD-10-CM

## 2021-10-20 DIAGNOSIS — J449 Chronic obstructive pulmonary disease, unspecified: Secondary | ICD-10-CM | POA: Diagnosis not present

## 2021-10-20 DIAGNOSIS — I2699 Other pulmonary embolism without acute cor pulmonale: Secondary | ICD-10-CM

## 2021-10-20 DIAGNOSIS — Z86718 Personal history of other venous thrombosis and embolism: Secondary | ICD-10-CM

## 2021-10-20 DIAGNOSIS — Z87891 Personal history of nicotine dependence: Secondary | ICD-10-CM

## 2021-10-20 LAB — CBC WITH DIFFERENTIAL/PLATELET
Abs Immature Granulocytes: 0.04 10*3/uL (ref 0.00–0.07)
Basophils Absolute: 0.1 10*3/uL (ref 0.0–0.1)
Basophils Relative: 1 %
Eosinophils Absolute: 0.2 10*3/uL (ref 0.0–0.5)
Eosinophils Relative: 3 %
HCT: 42.5 % (ref 36.0–46.0)
Hemoglobin: 13.7 g/dL (ref 12.0–15.0)
Immature Granulocytes: 1 %
Lymphocytes Relative: 28 %
Lymphs Abs: 2.5 10*3/uL (ref 0.7–4.0)
MCH: 31.5 pg (ref 26.0–34.0)
MCHC: 32.2 g/dL (ref 30.0–36.0)
MCV: 97.7 fL (ref 80.0–100.0)
Monocytes Absolute: 0.7 10*3/uL (ref 0.1–1.0)
Monocytes Relative: 8 %
Neutro Abs: 5.3 10*3/uL (ref 1.7–7.7)
Neutrophils Relative %: 59 %
Platelets: 300 10*3/uL (ref 150–400)
RBC: 4.35 MIL/uL (ref 3.87–5.11)
RDW: 13.2 % (ref 11.5–15.5)
WBC: 8.9 10*3/uL (ref 4.0–10.5)
nRBC: 0 % (ref 0.0–0.2)

## 2021-10-20 LAB — D-DIMER, QUANTITATIVE: D-Dimer, Quant: 0.4 ug/mL-FEU (ref 0.00–0.50)

## 2021-10-20 MED ORDER — PREDNISONE 50 MG PO TABS: 50.0000 mg | ORAL_TABLET | ORAL | 0 refills | Status: DC

## 2021-10-20 MED ORDER — DIPHENHYDRAMINE HCL 50 MG PO TABS: 50.0000 mg | ORAL_TABLET | ORAL | 0 refills | Status: DC

## 2021-10-20 NOTE — Patient Instructions (Addendum)
[facility] at [facility] Discharge Instructions  You were seen today by Dr. Ellin Saba & Rojelio Brenner PA-C for your pulmonary embolism.  As we discussed, this was most likely caused by your COVID-19 infection in May 2022.  However, before taking you off of Eliquis, we want to make sure it is safe for you to do so.  We will check repeat CTA of your chest to see if the blood clot has resolved.  Because of your possible allergy to iodine contrast, you will need to take the following before the CT scan: - Prednisone 50 mg 13 hours before CT scan - Prednisone 50 mg 7 hours before CT scan - Prednisone 50 mg 1 hour before CT scan - Benadryl (diphenhydramine) 50 mg 1 hour before CT scan  We will also check blood tests today to see if you have any underlying blood clotting disorder.  Please be aware that some of these tests can be skewed by the fact that you are on Eliquis, so do not be alarmed if they show up as positive.  If any of these tests are positive, we will repeat them after you have stopped Eliquis in the future.  LABS: Check labs today before leaving the hospital building  OTHER TESTS: CTA chest  MEDICATIONS: Continue Eliquis 5 mg twice daily  FOLLOW-UP APPOINTMENT: Office visit in 1 month, after testing has been completed   Thank you for choosing [facility] at [facility] to provide your oncology and hematology care.  To afford each patient quality time with our provider, please arrive at least 15 minutes before your scheduled appointment time.   If you have a lab appointment with the Cancer Center please come in thru the Main Entrance and check in at the main information desk.  You need to re-schedule your appointment should you arrive 10 or more minutes late.  We strive to give you quality time with our providers, and arriving late affects you and other patients whose appointments are after yours.  Also, if you no show three or  more times for appointments you may be dismissed from the clinic at the providers discretion.     Again, thank you for choosing [facility].  Our hope is that these requests will decrease the amount of time that you wait before being seen by our physicians.       _____________________________________________________________  Should you have questions after your visit to [facility], please contact our office at [phone] and follow the prompts.  Our office hours are 8:00 a.m. and 4:30 p.m. Monday - Friday.  Please note that voicemails left after 4:00 p.m. may not be returned until the following business day.  We are closed weekends and major holidays.  You do have access to a nurse 24-7, just call the main number to the clinic [phone] and do not press any options, hold on the line and a nurse will answer the phone.    For prescription refill requests, have your pharmacy contact our office and allow 72 hours.    Due to Covid, you will need to wear a mask upon entering the hospital. If you do not have a mask, a mask will be given to you at the Main Entrance upon arrival. For doctor visits, patients may have 1 support person age 19 or older with them. For treatment visits, patients can not have anyone with them due to social distancing guidelines and our  immunocompromised population.

## 2021-10-22 LAB — BETA-2-GLYCOPROTEIN I ABS, IGG/M/A
Beta-2 Glyco I IgG: <9 GPI IgG units (ref 0–20)
Beta-2-Glycoprotein I IgA: <9 GPI IgA units (ref 0–25)
Beta-2-Glycoprotein I IgM: <9 GPI IgM units (ref 0–32)

## 2021-10-22 LAB — CARDIOLIPIN ANTIBODIES, IGG, IGM, IGA
Anticardiolipin IgA: <9 APL U/mL (ref 0–11)
Anticardiolipin IgG: <9 GPL U/mL (ref 0–14)
Anticardiolipin IgM: <9 MPL U/mL (ref 0–12)

## 2021-10-23 LAB — FACTOR 5 LEIDEN

## 2021-10-26 LAB — PROTHROMBIN GENE MUTATION

## 2021-11-24 ENCOUNTER — Other Ambulatory Visit: Payer: Self-pay

## 2021-11-24 ENCOUNTER — Encounter (HOSPITAL_COMMUNITY): Payer: Self-pay | Admitting: Radiology

## 2021-11-24 ENCOUNTER — Ambulatory Visit (HOSPITAL_COMMUNITY)
Admission: RE | Admit: 2021-11-24 | Discharge: 2021-11-24 | Disposition: A | Discharge status: Home / Self Care | Payer: Medicare Other | Source: Ambulatory Visit | Attending: Physician Assistant | Admitting: Physician Assistant

## 2021-11-24 DIAGNOSIS — I2699 Other pulmonary embolism without acute cor pulmonale: Secondary | ICD-10-CM | POA: Insufficient documentation

## 2021-11-24 DIAGNOSIS — Z86711 Personal history of pulmonary embolism: Secondary | ICD-10-CM | POA: Diagnosis present

## 2021-11-24 DIAGNOSIS — Z86718 Personal history of other venous thrombosis and embolism: Secondary | ICD-10-CM | POA: Insufficient documentation

## 2021-11-24 LAB — POCT I-STAT CREATININE: Creatinine, Ser: 0.9 mg/dL (ref 0.44–1.00)

## 2021-11-24 MED ORDER — IOHEXOL 350 MG/ML SOLN
100.0000 mL | Freq: Once | INTRAVENOUS | Status: AC | PRN
  Administered 2021-11-24: 75 mL via INTRAVENOUS

## 2021-11-26 ENCOUNTER — Ambulatory Visit (HOSPITAL_COMMUNITY): Payer: Medicare Other | Admitting: Hematology

## 2021-11-26 ENCOUNTER — Inpatient Hospital Stay (HOSPITAL_COMMUNITY): Payer: Medicare Other | Attending: Hematology | Admitting: Physician Assistant

## 2021-11-26 ENCOUNTER — Other Ambulatory Visit: Payer: Self-pay

## 2021-11-26 VITALS — BP 150/82 | HR 68 | Temp 98.0°F | Resp 18 | Ht 65.0 in | Wt 161.8 lb

## 2021-11-26 DIAGNOSIS — Z87891 Personal history of nicotine dependence: Secondary | ICD-10-CM | POA: Insufficient documentation

## 2021-11-26 DIAGNOSIS — I2699 Other pulmonary embolism without acute cor pulmonale: Secondary | ICD-10-CM | POA: Diagnosis not present

## 2021-11-26 DIAGNOSIS — Z7952 Long term (current) use of systemic steroids: Secondary | ICD-10-CM | POA: Diagnosis not present

## 2021-11-26 DIAGNOSIS — Z86718 Personal history of other venous thrombosis and embolism: Secondary | ICD-10-CM | POA: Diagnosis not present

## 2021-11-26 DIAGNOSIS — Z86711 Personal history of pulmonary embolism: Secondary | ICD-10-CM | POA: Insufficient documentation

## 2021-11-26 DIAGNOSIS — Z79899 Other long term (current) drug therapy: Secondary | ICD-10-CM | POA: Diagnosis not present

## 2021-11-26 DIAGNOSIS — Z7901 Long term (current) use of anticoagulants: Secondary | ICD-10-CM | POA: Insufficient documentation

## 2021-11-26 NOTE — Patient Instructions (Signed)
[facility] at [facility] ?Discharge Instructions ? ?You were seen today by Rojelio Brenner PA-C for your history of blood clots.  Your most recent CT scan showed that your blood clots have resolved.  Your blood tests did not show any underlying conditions that would have caused a blood clot. ? ?You can now STOP taking your Eliquis. ? ?We will check a few other labs in about 6 weeks, since these labs could not be checked while you were on Eliquis.  These tests will tell us if you have any other blood conditions that would cause you to have blood clots.  ? ?We will schedule you for a phone visit to discuss these results. ?If these next blood tests are normal, you will not need to see [place] in the office again. ?HOWEVER, if you have any blood clots again in the future, you will have to come back to see [place] again. ? ?Please READ CAREFULLY through the attached handouts, especially regarding symptoms of recurrent blood clots.  If these symptoms occur, you should seek IMMEDIATE MEDICAL attention. ? ?LABS: Return in 6 weeks for repeat labs. ? ?MEDICATIONS: You can STOP Eliquis ? ?FOLLOW-UP APPOINTMENT: Phone visit in 8 weeks ? ? ?Thank you for choosing [facility] at [facility] to provide your oncology and hematology care.  To afford each patient quality time with our provider, please arrive at least 15 minutes before your scheduled appointment time.  ? ?If you have a lab appointment with the Cancer Center please come in thru the Main Entrance and check in at the main information desk. ? ?You need to re-schedule your appointment should you arrive 10 or more minutes late.  We strive to give you quality time with our providers, and arriving late affects you and other patients whose appointments are after yours.  Also, if you no show three or more times for appointments you may be dismissed from the clinic at the providers discretion.     ?Again, thank you for choosing [facility].  Our hope is that these requests will decrease the amount of time that you wait before being seen by our physicians.       ?_____________________________________________________________ ? ?Should you have questions after your visit to [facility], please contact our office at [phone] and follow the prompts.  Our office hours are 8:00 a.m. and 4:30 p.m. Monday - Friday.  Please note that voicemails left after 4:00 p.m. may not be returned until the following business day.  We are closed weekends and major holidays.  You do have access to a nurse 24-7, just call the main number to the clinic [phone] and do not press any options, hold on the line and a nurse will answer the phone.   ? ?For prescription refill requests, have your pharmacy contact our office and allow 72 hours.   ? ?Due to Covid, you will need to wear a mask upon entering the hospital. If you do not have a mask, a mask will be given to you at the Main Entrance upon arrival. For doctor visits, patients may have 1 support person age 66 or older with them. For treatment visits, patients can not have anyone with them due to social distancing guidelines and our immunocompromised population.  ? ? ? ?

## 2021-11-26 NOTE — Progress Notes (Signed)
? ?[facility] ?[address] ?[place], [place] [address] ? ? ?CLINIC:  ?Medical Oncology/Hematology ? ?PCP:  ?Rachael Maywood, FNP ?[place] ?[place] [id number] ?[phone] ? ? ?REASON FOR VISIT:  ?Follow-up for pulmonary embolism ? ?CURRENT THERAPY: Eliquis 5 mg twice daily ? ?INTERVAL HISTORY: Fair ?Rachael Mullins 71 y.o. female returns for routine follow-up of her pulmonary embolism.  She was seen by Dr. Delton Coombes and Tarri Abernethy PA-C on 10/20/2021. ? ?At today's visit, she reports feeling well.  No recent hospitalizations, surgeries, or changes in baseline health status. ? ?Her breathing has improved since the time of her last visit.  She does continue to have some underlying dyspnea on exertion that she thinks may be related to her COPD or prolonged effects from COVID-19.  She has not had any chest pain, cough, or hemoptysis.  She has been taking Eliquis 5 mg twice daily and has not had any bleeding issues, although she does report easy bruising. ? ?She has 75% energy and 100% appetite. She endorses that she is maintaining a stable weight. ? ? ?REVIEW OF SYSTEMS:  ?Review of Systems  ?Constitutional:  Negative for appetite change, chills, diaphoresis, fatigue, fever and unexpected weight change.  ?HENT:   Negative for lump/mass and nosebleeds.   ?Eyes:  Negative for eye problems.  ?Respiratory:  Positive for shortness of breath (with exertion). Negative for cough and hemoptysis.   ?Cardiovascular:  Negative for chest pain, leg swelling and palpitations.  ?Gastrointestinal:  Negative for abdominal pain, blood in stool, constipation, diarrhea, nausea and vomiting.  ?Genitourinary:  Negative for hematuria.   ?Skin: Negative.   ?Neurological:  Positive for dizziness. Negative for headaches and light-headedness.  ?Hematological:  Does not bruise/bleed easily.   ? ? ?PAST MEDICAL/SURGICAL HISTORY:  ?Past Medical History:  ?Diagnosis Date  ? COPD (chronic obstructive pulmonary disease) ([place])    ? ?Past Surgical History:  ?Procedure Laterality Date  ? ABDOMINAL HYSTERECTOMY    ? TONSILLECTOMY    ? ? ? ?SOCIAL HISTORY:  ?Social History  ? ?Socioeconomic History  ? Marital status: Widowed  ?  Spouse name: Not on file  ? Number of children: Not on file  ? Years of education: Not on file  ? Highest education level: Not on file  ?Occupational History  ? Not on file  ?Tobacco Use  ? Smoking status: Former  ?  Packs/day: 0.50  ?  Types: Cigarettes  ?  Start date: 09/13/1966  ?  Quit date: 03/13/2021  ?  Years since quitting: 0.7  ? Smokeless tobacco: Never  ?Substance and Sexual Activity  ? Alcohol use: Never  ? Drug use: Never  ? Sexual activity: Not Currently  ?Other Topics Concern  ? Not on file  ?Social History Narrative  ? Not on file  ? ?Social Determinants of Health  ? ?Financial Resource Strain: Not on file  ?Food Insecurity: Not on file  ?Transportation Needs: Not on file  ?Physical Activity: Not on file  ?Stress: Not on file  ?Social Connections: Not on file  ?Intimate Partner Violence: Not on file  ? ? ?FAMILY HISTORY:  ?Family History  ?Problem Relation Age of Onset  ? Asthma Maternal Grandmother   ? Diabetes Maternal Grandmother   ? Asthma Paternal Grandmother   ? Alpha-1 antitrypsin deficiency Neg Hx   ? Tuberous sclerosis Neg Hx   ? Lung disease Neg Hx   ? ? ?CURRENT MEDICATIONS:  ?Outpatient Encounter Medications as of 11/26/2021  ?Medication  Sig  ? diclofenac (VOLTAREN) 50 MG EC tablet Take 50 mg by mouth 2 (two) times daily.  ? diphenhydrAMINE (BENADRYL) 50 MG tablet Take 1 tablet (50 mg total) by mouth as directed for 1 dose. Take 1 tablet (50 mg) 1 hour before CT scan  ? ELIQUIS DVT/PE STARTER PACK 5 mg See admin instructions. One bid  ? furosemide (LASIX) 20 MG tablet Take 20 mg by mouth daily.  ? NON FORMULARY Take 2 tablets by mouth 2 (two) times daily. Takes Vit D, Zinc and Calcium together.  ? OVER THE COUNTER MEDICATION Vit d 3 2,000 iu daily  ? predniSONE (DELTASONE) 50 MG tablet Take 1  tablet (50 mg total) by mouth as directed for 3 doses. Take 1 tablet 13 hours before CT scan.  Then take 1 tablet 7 hours before CT scan.  Then take 1 tablet 1 hour before CT scan.  ? ?No facility-administered encounter medications on file as of 11/26/2021.  ? ? ?ALLERGIES:  ?Allergies  ?Allergen Reactions  ? Ivp Dye [Iodinated Contrast Media] Hives  ?  Patient reports that she had rash, hives and breaks out all over.  ?  ? Codeine Nausea Only  ? Nitrofurantoin Other (See Comments)  ?  Other reaction(s): Rapid heartbeat  ? ? ? ?PHYSICAL EXAM:  ?ECOG PERFORMANCE STATUS: 1 - Symptomatic but completely ambulatory ? ?There were no vitals filed for this visit. ?There were no vitals filed for this visit. ?Physical Exam ?Constitutional:   ?   Appearance: Normal appearance.  ?HENT:  ?   Head: Normocephalic and atraumatic.  ?   Mouth/Throat:  ?   Mouth: Mucous membranes are moist.  ?Eyes:  ?   Extraocular Movements: Extraocular movements intact.  ?   Pupils: Pupils are equal, round, and reactive to light.  ?Cardiovascular:  ?   Rate and Rhythm: Normal rate and regular rhythm.  ?   Pulses: Normal pulses.  ?   Heart sounds: Normal heart sounds.  ?Pulmonary:  ?   Effort: Pulmonary effort is normal.  ?   Breath sounds: Normal breath sounds.  ?Abdominal:  ?   General: Bowel sounds are normal.  ?   Palpations: Abdomen is soft.  ?   Tenderness: There is no abdominal tenderness.  ?Musculoskeletal:     ?   General: No swelling.  ?   Right lower leg: Edema (trace ankle edema) present.  ?   Left lower leg: No edema.  ?Lymphadenopathy:  ?   Cervical: No cervical adenopathy.  ?Skin: ?   General: Skin is warm and dry.  ?   Comments: Varicose veins over bilateral lower extremities, right greater than left  ?Neurological:  ?   General: No focal deficit present.  ?   Mental Status: She is alert and oriented to person, place, and time.  ?Psychiatric:     ?   Mood and Affect: Mood normal.     ?   Behavior: Behavior normal.  ? ? ? ?LABORATORY  DATA:  ?I have reviewed the labs as listed.  ?CBC ?   ?Component Value Date/Time  ? WBC 8.9 10/20/2021 1200  ? RBC 4.35 10/20/2021 1200  ? HGB 13.7 10/20/2021 1200  ? HCT 42.5 10/20/2021 1200  ? PLT 300 10/20/2021 1200  ? MCV 97.7 10/20/2021 1200  ? MCH 31.5 10/20/2021 1200  ? MCHC 32.2 10/20/2021 1200  ? RDW 13.2 10/20/2021 1200  ? LYMPHSABS 2.5 10/20/2021 1200  ? MONOABS 0.7 10/20/2021 1200  ?  EOSABS 0.2 10/20/2021 1200  ? BASOSABS 0.1 10/20/2021 1200  ? ?CMP Latest Ref Rng & Units 11/24/2021  ?Creatinine 0.44 - 1.00 mg/dL 0.90  ? ? ?DIAGNOSTIC IMAGING:  ?I have independently reviewed the relevant imaging and discussed with the patient. ? ?ASSESSMENT & PLAN: ?1.  Pulmonary embolism, bilateral, provoked in the setting of COVID-19 ?- Seen at the request of her primary care provider (NP Henry Russel) for evaluation of pulmonary embolism. ?- Patient was diagnosed with bilateral PEs with right ventricular heart strain on 02/19/2021, thought to be provoked by COVID-19 (May 2022); admitted to [facility] in May 2022. ?- She took Eliquis for 3 months, and discontinued it in September 2022, but admitted to not taking her Eliquis as prescribed - for several weeks she only took the medication once a day rather than twice daily. ?- She was seen by her PCP on 06/30/2021 for DOE, and CTA chest at that time showed redemonstration of bilateral PEs in the same distribution area as original PEs.  Venous duplex was negative for DVTs.  At that time, she was resumed on Eliquis. ?- She was briefly on Xarelto due to concern for possible reaction to Eliquis, but this was later felt to be a reaction to IV contrast rather than to Eliquis.  She has not had any issues with Eliquis after being restarted on it. ?- She is taking Eliquis as prescribed.  She has not had any major bleeding events.  ?- She has some mild residual dyspnea on exertion from her COPD and prior COVID infection ?- No previous DVT or PE.  No personal or family  history of miscarriages.  No family history of coagulopathy. ?- Coagulopathy panel (10/20/2021): NEGATIVE factor V Leiden, PT gene mutation, beta-2 glycoprotein 1 antibodies, and anticardiolipin antibodies.  (

## 2022-01-18 ENCOUNTER — Other Ambulatory Visit (HOSPITAL_COMMUNITY): Payer: Medicare Other

## 2022-01-21 ENCOUNTER — Inpatient Hospital Stay (HOSPITAL_COMMUNITY): Payer: Medicare Other | Attending: Hematology

## 2022-01-21 DIAGNOSIS — J449 Chronic obstructive pulmonary disease, unspecified: Secondary | ICD-10-CM | POA: Diagnosis not present

## 2022-01-21 DIAGNOSIS — Z7901 Long term (current) use of anticoagulants: Secondary | ICD-10-CM | POA: Insufficient documentation

## 2022-01-21 DIAGNOSIS — I2699 Other pulmonary embolism without acute cor pulmonale: Secondary | ICD-10-CM | POA: Insufficient documentation

## 2022-01-21 DIAGNOSIS — Z86718 Personal history of other venous thrombosis and embolism: Secondary | ICD-10-CM

## 2022-01-21 LAB — ANTITHROMBIN III: AntiThromb III Func: 130 % — ABNORMAL HIGH (ref 75–120)

## 2022-01-23 LAB — LUPUS ANTICOAGULANT
DRVVT: 34.3 s (ref 0.0–47.0)
PTT Lupus Anticoagulant: 25.6 s (ref 0.0–43.5)
Thrombin Time: 18.5 s (ref 0.0–23.0)
dPT Confirm Ratio: 1.14 Ratio (ref 0.00–1.34)
dPT: 36.5 s (ref 0.0–47.6)

## 2022-01-23 LAB — PROTEIN C ACTIVITY: Protein C Activity: 190 % — ABNORMAL HIGH (ref 73–180)

## 2022-01-23 LAB — PROTEIN S, TOTAL: Protein S Ag, Total: 147 % (ref 60–150)

## 2022-01-23 LAB — PROTEIN S ACTIVITY: Protein S Activity: 117 % (ref 63–140)

## 2022-01-23 LAB — PROTEIN C, TOTAL: Protein C, Total: 150 % (ref 60–150)

## 2022-01-25 ENCOUNTER — Ambulatory Visit (HOSPITAL_COMMUNITY): Payer: Medicare Other | Admitting: Physician Assistant

## 2022-02-03 NOTE — Progress Notes (Unsigned)
Virtual Visit via Telephone Note [facility]  I connected with Rachael Mullins  on 02/04/2022 at 12:04 PM by telephone and verified that I am speaking with the correct person using two identifiers.  Location: Patient: Home Provider: [facility]   I discussed the limitations, risks, security and privacy concerns of performing an evaluation and management service by telephone and the availability of in person appointments. I also discussed with the patient that there may be a patient responsible charge related to this service. The patient expressed understanding and agreed to proceed.  REASON FOR VISIT: Follow-up for pulmonary embolism  PRIOR THERAPY: Eliquis  CURRENT THERAPY: None  INTERVAL HISTORY: Rachael Mullins is contacted today for follow-up of her pulmonary embolism.  She was last seen on 11/26/2021 by Rojelio Brenner PA-C . At today's visit, she reports feeling fair.  She has not had any hospitalizations, surgeries, or changes in baseline health status since her last visit. Eliquis was discontinued at her last appointment on 11/26/2021.  She has not had any worsening in her breathing, new onset chest pain, hemoptysis, or unilateral leg swelling after Eliquis was discontinued.  She continues to have some underlying dyspnea on exertion, fatigue, and chest tightness that she thinks may be related to her COPD or prolonged effects from COVID-19.  She reports that the symptoms began at the time of her COVID-19 diagnosis and pulmonary embolism, and were not present prior to that.  The symptoms have improved, but not gone back to her baseline.  She reports 40% energy and 100% appetite.  She reports that she is maintaining a stable weight at this time.    OBSERVATIONS/OBJECTIVE: Review of Systems  Constitutional:  Positive for malaise/fatigue. Negative for chills, diaphoresis, fever and weight loss.  Respiratory:  Positive for shortness of breath (with  exertion). Negative for cough.        Chest tightness  Cardiovascular:  Negative for chest pain and palpitations.  Gastrointestinal:  Negative for abdominal pain, blood in stool, melena, nausea and vomiting.  Neurological:  Negative for dizziness and headaches.    PHYSICAL EXAM (per limitations of virtual telephone visit): The patient is alert and oriented x 3, exhibiting adequate mentation, good mood, and ability to speak in full sentences and execute sound judgement.   ASSESSMENT & PLAN: 1.  Pulmonary embolism, bilateral, provoked in the setting of COVID-19 - Seen at the request of her primary care provider (NP Alfonso Ramus) for evaluation of pulmonary embolism. - Patient was diagnosed with bilateral PEs with right ventricular heart strain on 02/19/2021, thought to be provoked by COVID-19 (May 2022); admitted to [facility] in May 2022. - She took Eliquis for 3 months, and discontinued it in September 2022, but admitted to not taking her Eliquis as prescribed - for several weeks she only took the medication once a day rather than twice daily. - She was seen by her PCP on 06/30/2021 for DOE, and CTA chest at that time showed redemonstration of bilateral PEs in the same distribution area as original PEs.  Venous duplex was negative for DVTs.  At that time, she was resumed on Eliquis. - She was briefly on Xarelto due to concern for possible reaction to Eliquis, but this was later felt to be a reaction to IV contrast rather than to Eliquis.  She has not had any issues with Eliquis after being restarted on it. - She has some mild residual dyspnea on exertion, fatigue, and chest tightness from  her COPD and prior COVID infection   - No previous DVT or PE.  No personal or family history of miscarriages.  No family history of coagulopathy. - Coagulopathy panel (10/20/2021): NEGATIVE factor V Leiden, PT gene mutation, beta-2 glycoprotein 1 antibodies, and anticardiolipin antibodies.  (Lupus  anticoagulant panel was ordered, but not obtained) - CTA chest (11/24/2021) shows no evidence of pulmonary embolus - D-dimer (10/20/2021): Normal at 0.40 - Since CTA on 06/30/2021 showed redistribution of PE in same pattern as first PE, differential diagnosis favors residual PE from a suboptimal treatment rather than recurrent PE. - Eliquis was discontinued as of 11/26/2021.   - Additional coagulopathy panel obtained after discontinuation of Eliquis (01/21/2022) showed essentially normal protein S.  Minimally elevated protein C and Antithrombin III of no clinical significance.  Lupus anticoagulant panel negative. - No current symptoms suspicious for recurrent DVT or PE - PLAN: There are no abnormalities on coagulopathy panel.  Bilateral PEs considered provoked in the setting of COVID-19.  No clear indication for continued anticoagulation at this time. - Therefore, we will discharge to PCP with understanding that patient should be referred back to [place] in the future if she has any other DVT or PE.   2.  Other history - PMH: COPD, venous insufficiency - SOCIAL: Former smoker (0.25 PPD x50 years).  No alcohol or illicit drugs.  She works as a Diplomatic Services operational officer for [employer]. - FAMILY: Unremarkable per patient report.    I discussed the assessment and treatment plan with the patient. The patient was provided an opportunity to ask questions and all were answered. The patient agreed with the plan and demonstrated an understanding of the instructions.   The patient was advised to call back or seek an in-person evaluation if the symptoms worsen or if the condition fails to improve as anticipated.  I provided 13 minutes of non-face-to-face time during this encounter.   Carnella Guadalajara, PA-C 02/04/2022 1:24 PM

## 2022-02-04 ENCOUNTER — Inpatient Hospital Stay (HOSPITAL_BASED_OUTPATIENT_CLINIC_OR_DEPARTMENT_OTHER): Payer: Medicare Other | Admitting: Physician Assistant

## 2022-02-04 DIAGNOSIS — Z86718 Personal history of other venous thrombosis and embolism: Secondary | ICD-10-CM | POA: Diagnosis not present

## 2022-03-03 ENCOUNTER — Ambulatory Visit: Payer: Medicare Other | Admitting: Internal Medicine

## 2022-03-17 ENCOUNTER — Ambulatory Visit: Payer: Medicare Other | Admitting: Internal Medicine

## 2022-04-08 ENCOUNTER — Inpatient Hospital Stay (HOSPITAL_COMMUNITY): Payer: Medicare Other | Attending: Hematology

## 2022-04-08 ENCOUNTER — Other Ambulatory Visit (HOSPITAL_COMMUNITY): Payer: Self-pay | Admitting: Physician Assistant

## 2022-04-08 ENCOUNTER — Inpatient Hospital Stay (HOSPITAL_BASED_OUTPATIENT_CLINIC_OR_DEPARTMENT_OTHER): Payer: Medicare Other | Admitting: Physician Assistant

## 2022-04-08 VITALS — BP 145/79 | HR 64 | Temp 98.8°F | Resp 18 | Ht 65.0 in | Wt 161.7 lb

## 2022-04-08 DIAGNOSIS — R7989 Other specified abnormal findings of blood chemistry: Secondary | ICD-10-CM | POA: Insufficient documentation

## 2022-04-08 DIAGNOSIS — J449 Chronic obstructive pulmonary disease, unspecified: Secondary | ICD-10-CM | POA: Insufficient documentation

## 2022-04-08 DIAGNOSIS — I2699 Other pulmonary embolism without acute cor pulmonale: Secondary | ICD-10-CM | POA: Insufficient documentation

## 2022-04-08 DIAGNOSIS — Z86718 Personal history of other venous thrombosis and embolism: Secondary | ICD-10-CM

## 2022-04-08 DIAGNOSIS — Z7901 Long term (current) use of anticoagulants: Secondary | ICD-10-CM

## 2022-04-08 DIAGNOSIS — R5383 Other fatigue: Secondary | ICD-10-CM

## 2022-04-08 LAB — CBC
HCT: 41.9 % (ref 36.0–46.0)
Hemoglobin: 13.4 g/dL (ref 12.0–15.0)
MCH: 30.9 pg (ref 26.0–34.0)
MCHC: 32 g/dL (ref 30.0–36.0)
MCV: 96.8 fL (ref 80.0–100.0)
Platelets: 320 10*3/uL (ref 150–400)
RBC: 4.33 MIL/uL (ref 3.87–5.11)
RDW: 12.7 % (ref 11.5–15.5)
WBC: 8.8 10*3/uL (ref 4.0–10.5)
nRBC: 0 % (ref 0.0–0.2)

## 2022-04-08 LAB — D-DIMER, QUANTITATIVE: D-Dimer, Quant: 0.57 ug/mL-FEU — ABNORMAL HIGH (ref 0.00–0.50)

## 2022-04-08 NOTE — Patient Instructions (Signed)
[facility] at [facility] Discharge Instructions  You were seen today by Rojelio Brenner PA-C for your history of blood clots in your lungs.  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  Seek IMMEDIATE medical attention if you notice any symptoms of recurrent blood clots (new swelling in 1 leg more than the other, shortness of breath, chest pain, coughing up blood, racing heartbeat).  Seek IMMEDIATE medical attention if you have any major bleeding episodes while taking blood thinner.  We will see you for follow-up in 6 months.   Thank you for choosing  Cancer Center at [facility] to provide your oncology and hematology care.  To afford each patient quality time with our provider, please arrive at least 15 minutes before your scheduled appointment time.   If you have a lab appointment with the Cancer Center please come in thru the Main Entrance and check in at the main information desk.  You need to re-schedule your appointment should you arrive 10 or more minutes late.  We strive to give you quality time with our providers, and arriving late affects you and other patients whose appointments are after yours.  Also, if you no show three or more times for appointments you may be dismissed from the clinic at the providers discretion.     Again, thank you for choosing [facility].  Our hope is that these requests will decrease the amount of time that you wait before being seen by our physicians.       _____________________________________________________________  Should you have questions after your visit to [facility], please contact our office at [phone] and follow the prompts.  Our office hours are 8:00 a.m. and 4:30 p.m. Monday - Friday.  Please note that voicemails left after 4:00 p.m. may not be returned until the following business  day.  We are closed weekends and major holidays.  You do have access to a nurse 24-7, just call the main number to the clinic [phone] and do not press any options, hold on the line and a nurse will answer the phone.    For prescription refill requests, have your pharmacy contact our office and allow 72 hours.    Due to Covid, you will need to wear a mask upon entering the hospital. If you do not have a mask, a mask will be given to you at the Main Entrance upon arrival. For doctor visits, patients may have 1 support person age 36 or older with them. For treatment visits, patients can not have anyone with them due to social distancing guidelines and our immunocompromised population.

## 2022-04-08 NOTE — Progress Notes (Signed)
[facility] [address], [place] [address]   CLINIC:  Medical Oncology/Hematology  PCP:  Vanessa Iroquois, FNP [address] [place] [address] [phone]   REASON FOR VISIT:  Follow-up for recurrent pulmonary embolism  CURRENT THERAPY: Eliquis  INTERVAL HISTORY:  [name] 71 y.o. female returns for routine follow-up of her recurrent pulmonary embolism.  She was last evaluated via telemedicine visit by Rojelio Brenner PA-C on 02/04/2022.  At her last visit, patient was discharged from hematology clinic.  She had had provoked pulmonary embolism in the setting of COVID-19.  Hypercoagulable work-up was negative and CTA chest confirmed resolution of pulmonary embolism.  Therefore, she was discharged from clinic with instructions to return if she had any recurrent VTE events.  Unfortunately, she had recurrent unprovoked pulmonary embolism in May 2023.  She was admitted to [facility] in [place] on 02/08/2022 after presenting to the ED with complaints of SOB for the past 3 weeks, and was found to have bilateral submassive PE with cor pulmonale and acute hypoxic respiratory failure.  She was placed back on Eliquis at the time of hospital discharge.  At today's visit, she reports feeling fair.  She denies any obvious provoking event with her most recent blood clot, no injury, prior hospitalization, surgery, prolonged immobility, or travel preceding her second PE.  She reports that she has been fatigued ever since her PE in May 2023, but is slowly regaining some of her energy.  She has some dyspnea on exertion and feels that her heart is "pounding and racing" with exertion.  She has chronic bilateral leg swelling, right slightly greater than left secondary to "leaky veins."  She denies any new unilateral leg swelling, pain, or erythema.  No shortness of breath at rest, chest pain, cough, hemoptysis, or palpitations.  She is taking Eliquis as prescribed and  denies any major bleeding events.  She has 40% energy and 100% appetite. She endorses that she is maintaining a stable weight.   REVIEW OF SYSTEMS:  Review of Systems  Constitutional:  Positive for fatigue. Negative for appetite change, chills, diaphoresis, fever and unexpected weight change.  HENT:   Negative for lump/mass and nosebleeds.   Eyes:  Negative for eye problems.  Respiratory:  Negative for cough, hemoptysis and shortness of breath.   Cardiovascular:  Positive for leg swelling and palpitations. Negative for chest pain.  Gastrointestinal:  Negative for abdominal pain, blood in stool, constipation, diarrhea, nausea and vomiting.  Genitourinary:  Negative for hematuria.   Skin: Negative.   Neurological:  Negative for dizziness, headaches and light-headedness.  Hematological:  Does not bruise/bleed easily.      PAST MEDICAL/SURGICAL HISTORY:  Past Medical History:  Diagnosis Date   COPD (chronic obstructive pulmonary disease) (HCC)    Past Surgical History:  Procedure Laterality Date   ABDOMINAL HYSTERECTOMY     TONSILLECTOMY       SOCIAL HISTORY:  Social History   Socioeconomic History   Marital status: Widowed    Spouse name: Not on file   Number of children: Not on file   Years of education: Not on file   Highest education level: Not on file  Occupational History   Not on file  Tobacco Use   Smoking status: Former    Packs/day: 0.50    Types: Cigarettes    Start date: 09/13/1966    Quit date: 03/13/2021    Years since quitting: 1.0   Smokeless tobacco: Never  Substance  and Sexual Activity   Alcohol use: Never   Drug use: Never   Sexual activity: Not Currently  Other Topics Concern   Not on file  Social History Narrative   Not on file   Social Determinants of Health   Financial Resource Strain: Not on file  Food Insecurity: Not on file  Transportation Needs: Not on file  Physical Activity: Not on file  Stress: Not on file  Social Connections:  Not on file  Intimate Partner Violence: Not on file    FAMILY HISTORY:  Family History  Problem Relation Age of Onset   Asthma Maternal Grandmother    Diabetes Maternal Grandmother    Asthma Paternal Grandmother    Alpha-1 antitrypsin deficiency Neg Hx    Tuberous sclerosis Neg Hx    Lung disease Neg Hx     CURRENT MEDICATIONS:  Outpatient Encounter Medications as of 04/08/2022  Medication Sig   furosemide (LASIX) 20 MG tablet Take 20 mg by mouth daily.   NON FORMULARY Take 2 tablets by mouth 2 (two) times daily. Takes Vit D, Zinc and Calcium together.   OVER THE COUNTER MEDICATION Vit d 3 2,000 iu daily   No facility-administered encounter medications on file as of 04/08/2022.    ALLERGIES:  Allergies  Allergen Reactions   Ivp Dye [Iodinated Contrast Media] Hives    Patient reports that she had rash, hives and breaks out all over.     Codeine Nausea Only   Nitrofurantoin Other (See Comments)    Other reaction(s): Rapid heartbeat     PHYSICAL EXAM:  ECOG PERFORMANCE STATUS: 1 - Symptomatic but completely ambulatory  There were no vitals filed for this visit. There were no vitals filed for this visit. Physical Exam Constitutional:      Appearance: Normal appearance.  HENT:     Head: Normocephalic and atraumatic.     Mouth/Throat:     Mouth: Mucous membranes are moist.  Eyes:     Extraocular Movements: Extraocular movements intact.     Pupils: Pupils are equal, round, and reactive to light.  Cardiovascular:     Rate and Rhythm: Normal rate and regular rhythm.     Pulses: Normal pulses.     Heart sounds: Normal heart sounds.  Pulmonary:     Effort: Pulmonary effort is normal.     Breath sounds: Normal breath sounds.  Abdominal:     General: Bowel sounds are normal.     Palpations: Abdomen is soft.     Tenderness: There is no abdominal tenderness.  Musculoskeletal:        General: No swelling.     Right lower leg: Edema (trace ankle edema) present.     Left  lower leg: No edema.  Lymphadenopathy:     Cervical: No cervical adenopathy.  Skin:    General: Skin is warm and dry.     Comments: Varicose veins over bilateral lower extremities, right greater than left  Neurological:     General: No focal deficit present.     Mental Status: She is alert and oriented to person, place, and time.  Psychiatric:        Mood and Affect: Mood normal.        Behavior: Behavior normal.      LABORATORY DATA:  I have reviewed the labs as listed.  CBC    Component Value Date/Time   WBC 8.9 10/20/2021 1200   RBC 4.35 10/20/2021 1200   HGB 13.7 10/20/2021 1200  HCT 42.5 10/20/2021 1200   PLT 300 10/20/2021 1200   MCV 97.7 10/20/2021 1200   MCH 31.5 10/20/2021 1200   MCHC 32.2 10/20/2021 1200   RDW 13.2 10/20/2021 1200   LYMPHSABS 2.5 10/20/2021 1200   MONOABS 0.7 10/20/2021 1200   EOSABS 0.2 10/20/2021 1200   BASOSABS 0.1 10/20/2021 1200      Latest Ref Rng & Units 11/24/2021    5:25 PM  CMP  Creatinine 0.44 - 1.00 mg/dL 1.61     DIAGNOSTIC IMAGING:  I have independently reviewed the relevant imaging and discussed with the patient.  ASSESSMENT & PLAN: 1.  Pulmonary embolism, recurrent - Patient was diagnosed with bilateral PEs with right ventricular heart strain on 02/19/2021, thought to be provoked by COVID-19 infection and hospitalization in May 2022 - She took Eliquis for 3 months, and discontinued it in September 2022, but admitted to not taking her Eliquis as prescribed - for several weeks she only took the medication once a day rather than twice daily. - She was seen by her PCP on 06/30/2021 for DOE, and CTA chest at that time showed redemonstration of bilateral PEs in the same distribution area as original PEs (suspected to be residual PE from suboptimal treatment rather than recurrent PE).  Venous duplex was negative for DVTs.  At that time, she was resumed on Eliquis. - Coagulopathy panel (10/20/2021): NEGATIVE factor V Leiden, PT gene  mutation, beta-2 glycoprotein 1 antibodies, and anticardiolipin antibodies.  - CTA chest (11/24/2021) shows no evidence of pulmonary embolus - Eliquis was discontinued as of 11/26/2021.   - Additional coagulopathy panel obtained after discontinuation of Eliquis (01/21/2022) showed essentially normal protein S.  Minimally elevated protein C and Antithrombin III of no clinical significance.  Lupus anticoagulant panel negative. - Patient was discharged from hematology clinic on 02/04/2022 - since she had provoked PE in the setting of COVID-19 with negative hypercoagulable work-up and resolution of PE demonstrated on CTA chest. - Patient had recurrent unprovoked pulmonary embolism in May 2023.  She was admitted to [facility] in [place] on 02/08/2022 after presenting to the ED with complaints of SOB for the past 3 weeks, and was found to have bilateral submassive PE with cor pulmonale and acute hypoxic respiratory failure.  She was placed back on Eliqui s at the time of hospital discharge. -She continues to report dyspnea on exertion, fatigue, and racing heartbeat with exertion - Labs today (04/08/2022): CBC normal.  D-dimer mildly elevated at 0.57. - Unclear etiology of recurrent unprovoked PE.  Some studies have shown persistent procoagulant state in about 30% of patients at 12 to 18 months post COVID infection.  However, data on this is new and emerging, and no clear guidelines have yet been developed. - PLAN: Since patient has had recurrent PE, with second episode UNPROVOKED, recommend indefinite anticoagulation. - Continue Eliquis at current dose (5 mg twice daily) - We will continue to follow peripherally on an annual basis to address risk/benefit ratio of continued anticoagulation. - Repeat labs and RTC in 6 months.  Will schedule annual visits thereafter.  2.  Other history - PMH: COPD, venous insufficiency - SOCIAL: Former smoker (0.25 PPD x50 years).  No alcohol or illicit drugs.  She works as a  Diplomatic Services operational officer for [employer]. - FAMILY: Unremarkable per patient report.   PLAN SUMMARY & DISPOSITION: Same-day labs and office visit in 6 months  All questions were answered. The patient knows to call the clinic with any problems, questions or concerns.  Medical decision making: Moderate  Time spent on visit: I spent 20 minutes counseling the patient face to face. The total time spent in the appointment was 30 minutes and more than 50% was on counseling.   Carnella Guadalajara, PA-C  04/08/2022 3:09 PM

## 2022-05-20 ENCOUNTER — Encounter: Payer: Self-pay | Admitting: Internal Medicine

## 2022-05-20 ENCOUNTER — Ambulatory Visit (INDEPENDENT_AMBULATORY_CARE_PROVIDER_SITE_OTHER): Payer: Medicare Other | Admitting: Internal Medicine

## 2022-05-20 VITALS — BP 130/80 | HR 66 | Ht 65.0 in | Wt 165.4 lb

## 2022-05-20 DIAGNOSIS — I2602 Saddle embolus of pulmonary artery with acute cor pulmonale: Secondary | ICD-10-CM

## 2022-05-20 DIAGNOSIS — J449 Chronic obstructive pulmonary disease, unspecified: Secondary | ICD-10-CM

## 2022-05-20 DIAGNOSIS — U071 COVID-19: Secondary | ICD-10-CM | POA: Diagnosis not present

## 2022-05-20 NOTE — Progress Notes (Signed)
Rachael Mullins    [id number]    [birth year]  Primary Care Physician:Stanfield, Bryson Ha, FNP Date of Appointment: 05/20/2022 Established Patient Visit  Chief complaint:   Chief Complaint  Patient presents with   Follow-up    Blood clots     HPI: Rachael Mullins is a 71 y.o. woman with past medical history of tobacco use disorder and covid infection with provoked PE.   Interval Updates: She had completed 6 months of anticoagulation for provoked PE in March 2023. Then after stopping AC with eliquis she went back to [facility] for bilateral submassive PE with acute cor pulmonale and was felt to be unprovoked at that time.  Unclear if she was still in a transient pro-coagulopathic state following covid infection.   She has had hypercoag work up done by hematology which I have reviewed and was negative.   She has had follow echocardiogram which shows resolution of right heart strain.   Feeling mostly better but having issues with fatigue, stamina. Appetite is ok. No fevers, chills, night sweats.   No chest pain or wheezing.   Using albuterol rarely as needed. Needed more back in May.   I have reviewed the patient's family social and past medical history and updated as appropriate.   Past Medical History:  Diagnosis Date   COPD (chronic obstructive pulmonary disease) (HCC)     Past Surgical History:  Procedure Laterality Date   ABDOMINAL HYSTERECTOMY     TONSILLECTOMY      Family History  Problem Relation Age of Onset   Asthma Maternal Grandmother    Diabetes Maternal Grandmother    Asthma Paternal Grandmother    Alpha-1 antitrypsin deficiency Neg Hx    Tuberous sclerosis Neg Hx    Lung disease Neg Hx     Social History   Occupational History   Not on file  Tobacco Use   Smoking status: Former    Packs/day: 0.50    Types: Cigarettes    Start date: 09/13/1966    Quit date: 03/13/2021    Years since quitting: 1.1   Smokeless tobacco: Never  Substance and Sexual  Activity   Alcohol use: Never   Drug use: Never   Sexual activity: Not Currently     Physical Exam: Blood pressure 130/80, pulse 66, height 5\' 5"  (1.651 m), weight 165 lb 6.4 oz (75 kg), SpO2 98 %.  Gen:      No acute distress Lungs:    ctab no wheezes or crackles CV:         rrr no mrg, venous varicosities noted mild ankle edema   Data Reviewed: Imaging:  PFTs:     Latest Ref Rng & Units 10/08/2021   12:54 PM  PFT Results  FVC-Pre L 2.97   FVC-Predicted Pre % 101   FVC-Post L 2.90   FVC-Predicted Post % 99   Pre FEV1/FVC % % 69   Post FEV1/FCV % % 67   FEV1-Pre L 2.05   FEV1-Predicted Pre % 92   FEV1-Post L 1.96   DLCO uncorrected ml/min/mmHg 17.50   DLCO UNC% % 90   DLCO corrected ml/min/mmHg 17.50   DLCO COR %Predicted % 90   DLVA Predicted % 102   TLC L 5.85   TLC % Predicted % 115   RV % Predicted % 133    I have personally reviewed the patient's PFTs and very mild COPD FEV1 92% of predicted.   Labs:  Immunization  status:  There is no immunization history on file for this patient.  External Records Personally Reviewed: cardiology, Oncology  Assessment:  Mild COPD FEV 1 92% of predicted Recurrent VTE, unprovoked with acute cor pulmonale  Plan/Recommendations: Recommend lifelong anticoagulation barring any contra-indications Continue eliquis lifelong. Let us know if any bleeding problems.   I am referring you to pulmonary rehab at [facility] - if they don't have it, the next closest will be [facility].   Call me sooner if you need me.   Continue anoro and albuterol as needed for mild COPD.   Return to Care: Return in about 1 year (around 05/21/2023).   Durel Salts, MD Pulmonary and Critical Care Medicine [facility] Office:[phone]

## 2022-05-20 NOTE — Patient Instructions (Addendum)
Please schedule follow up scheduled with myself in 1 year.  If my schedule is not open yet, we will contact you with a reminder closer to that time. Please call [phone] if you haven't heard from [place] a month before.   Continue eliquis lifelong. Let us know if any bleeding problems.   I am referring you to pulmonary rehab at [facility] - if they don't have it, the next closest will be [facility].   Call me sooner if you need me.   Continue anoro and albuterol as needed for mild COPD.

## 2022-05-31 ENCOUNTER — Telehealth: Payer: Self-pay | Admitting: Internal Medicine

## 2022-05-31 NOTE — Telephone Encounter (Signed)
Patient is calling to see if Dr. Shearon Stalls was able to see if Rachael Mullins has a pulmonary rehab program. Please advise, call back [phone].

## 2022-06-02 NOTE — Telephone Encounter (Signed)
Patient is calling to see if we found out if pulmonary rehab was available with [facility]  Dr Shearon Stalls please advise

## 2022-06-03 NOTE — Telephone Encounter (Signed)
PCCs, do you all know if there any pulmonary rehabs in [place]?

## 2022-06-04 NOTE — Telephone Encounter (Signed)
[facility] in [place] used to have Pulmonary Rehab but I don't think they do anymore.  I just tried to call them to verify [phone]) to verify and recording states they are closed on Friday's.  Will try again on Monday.

## 2022-06-07 NOTE — Telephone Encounter (Signed)
Called the pt and there was no answer- LMTCB    

## 2022-06-07 NOTE — Telephone Encounter (Signed)
Spoke to [employer] in Pulmonary Lab at [facility].  She states they do not have pulmonary rehab dept.

## 2022-06-07 NOTE — Telephone Encounter (Signed)
Spoke to Rachael Mullins at [facility] in [place] she states they stopped [employer] on 11/2018.  She checked with pharmacist there and he states he thinks no Pulm Rehab in [place] but verify with [facility] - [phone].  Called them and # for Pulmonary Lab at hospital is [phone].  Operator doesn't know if they do rehab.  Tried to call them & phone just rings.  Will try again later.

## 2022-08-19 ENCOUNTER — Ambulatory Visit: Payer: Medicare Other | Admitting: Internal Medicine

## 2022-08-26 ENCOUNTER — Ambulatory Visit: Payer: Medicare Other | Admitting: Internal Medicine

## 2022-10-07 ENCOUNTER — Inpatient Hospital Stay: Payer: Medicare Other | Attending: Hematology

## 2022-10-07 DIAGNOSIS — Z86718 Personal history of other venous thrombosis and embolism: Secondary | ICD-10-CM

## 2022-10-07 DIAGNOSIS — Z7901 Long term (current) use of anticoagulants: Secondary | ICD-10-CM

## 2022-10-07 DIAGNOSIS — I2699 Other pulmonary embolism without acute cor pulmonale: Secondary | ICD-10-CM

## 2022-10-07 LAB — CBC WITH DIFFERENTIAL/PLATELET
Abs Immature Granulocytes: 0.08 10*3/uL — ABNORMAL HIGH (ref 0.00–0.07)
Basophils Absolute: 0.1 10*3/uL (ref 0.0–0.1)
Basophils Relative: 1 %
Eosinophils Absolute: 0 10*3/uL (ref 0.0–0.5)
Eosinophils Relative: 0 %
HCT: 40.1 % (ref 36.0–46.0)
Hemoglobin: 13 g/dL (ref 12.0–15.0)
Immature Granulocytes: 1 %
Lymphocytes Relative: 19 %
Lymphs Abs: 2.4 10*3/uL (ref 0.7–4.0)
MCH: 31.3 pg (ref 26.0–34.0)
MCHC: 32.4 g/dL (ref 30.0–36.0)
MCV: 96.6 fL (ref 80.0–100.0)
Monocytes Absolute: 1.3 10*3/uL — ABNORMAL HIGH (ref 0.1–1.0)
Monocytes Relative: 11 %
Neutro Abs: 8.5 10*3/uL — ABNORMAL HIGH (ref 1.7–7.7)
Neutrophils Relative %: 68 %
Platelets: 350 10*3/uL (ref 150–400)
RBC: 4.15 MIL/uL (ref 3.87–5.11)
RDW: 13 % (ref 11.5–15.5)
WBC: 12.4 10*3/uL — ABNORMAL HIGH (ref 4.0–10.5)
nRBC: 0 % (ref 0.0–0.2)

## 2022-10-07 LAB — D-DIMER, QUANTITATIVE: D-Dimer, Quant: <0.27 ug/mL-FEU (ref 0.00–0.50)

## 2022-10-13 NOTE — Progress Notes (Unsigned)
[place], [place] [address]   CLINIC:  Medical Oncology/Hematology  PCP:  Frances Maywood, FNP [address] [place] [address] [phone]   REASON FOR VISIT:  Follow-up for unprovoked pulmonary embolism x 2  CURRENT THERAPY: Eliquis  INTERVAL HISTORY:   Ms. Foots 72 y.o. female returns for routine follow-up of her recurrent unprovoked pulmonary embolisms, on chronic anticoagulation with Eliquis.  She was last seen by Tarri Abernethy PA-C on 04/08/2022.  At today's visit, she reports feeling fair.  Overall, she had been feeling better until she had another episode of COVID-19 in December 2023.  Since that time, she has had recurrent fatigue, dyspnea on exertion, and feeling that her heart is "pounding and racing" with exertion.  She has chronic bilateral leg swelling, right slightly greater than left secondary to "leaky veins."  She denies any new unilateral leg swelling, pain, or erythema.  No shortness of breath at rest, chest pain, cough, hemoptysis, or palpitations. She is taking Eliquis as prescribed and denies any major bleeding events.  She denies any falls within the past 6 months.  She bruises easily.   She had mild leukocytosis on most recent labs.  She denies any steroids.  She did have a UTI in December.  No B symptoms.  She has 80% energy and 100% appetite. She endorses that she is maintaining a stable weight.   ASSESSMENT & PLAN:  1.  Pulmonary embolism, recurrent - Patient was diagnosed with bilateral PEs with right ventricular heart strain on 02/19/2021, thought to be provoked by COVID-19 infection and hospitalization in May 2022 - She took Eliquis for 3 months, and discontinued it in September 2022, but admitted to not taking her Eliquis as prescribed - for several weeks she only took the medication once a day rather than twice daily. - She was seen by her PCP on 06/30/2021 for DOE, and CTA chest at that time showed  redemonstration of bilateral PEs in the same distribution area as original PEs (suspected to be residual PE from suboptimal treatment rather than recurrent PE).  Venous duplex was negative for DVTs.  At that time, she was resumed on Eliquis. - Coagulopathy panel (10/20/2021): NEGATIVE factor V Leiden, PT gene mutation, beta-2 glycoprotein 1 antibodies, and anticardiolipin antibodies.  - CTA chest (11/24/2021) shows no evidence of pulmonary embolus - Eliquis was discontinued as of 11/26/2021.   - Additional coagulopathy panel obtained after discontinuation of Eliquis (01/21/2022) showed essentially normal protein S.  Minimally elevated protein C and Antithrombin III of no clinical significance.  Lupus anticoagulant panel negative. - Patient was discharged from hematology clinic on 02/04/2022 - since she had provoked PE in the setting of COVID-19 with negative hypercoagulable work-up and resolution of PE demonstrated on CTA chest. - Patient had recurrent unprovoked pulmonary embolism in May 2023.  She was admitted to [facility] in [place] on 02/08/2022 after presenting to the ED with complaints of SOB for the past 3 weeks, and was found to have bilateral submassive PE with cor pulmonale and acute hypoxic respiratory failure.  She was placed back on Eliquis at the time of hospital discharge. - She continues to report dyspnea on exertion, fatigue, and racing heartbeat with exertion - Labs (10/07/2022): Hemoglobin normal, isolated elevation in WBC 12.4/ANC 8.5/monocytes 1.3.  D-dimer is undetectable.   - Unclear etiology of recurrent unprovoked PE.  Some studies have shown persistent procoagulant state in about 30% of patients at 12 to 18 months  post COVID infection.  However, data on this is new and emerging, and no clear guidelines have yet been developed. - PLAN: Since patient has had recurrent PE, with second episode UNPROVOKED, recommend indefinite anticoagulation. - Continue Eliquis at current dose (5 mg  twice daily) - We will continue to follow peripherally on an annual basis to address risk/benefit ratio of continued anticoagulation. - Due to isolated leukocytosis, we will check CBC/D only in 1 month - if any persistent elevation in WBC we will consider additional workup at that time - Labs in 1 year (BMP, CBC/D, D-dimer) with OFFICE visit 1-2 days after  2.  Other history - PMH: COPD, venous insufficiency - SOCIAL: Former smoker (0.25 PPD x50 years).  No alcohol or illicit drugs.  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 - FAMILY: Unremarkable per patient report.  PLAN SUMMARY: >> Labs only (CBC/D) in 1 month >> Labs in 1 year (BMP, CBC/D, D-dimer) >> OFFICE visit in 1 year, 1 to 2 days after labs     REVIEW OF SYSTEMS:   Review of Systems  Constitutional:  Positive for fatigue. Negative for appetite change, chills, diaphoresis, fever and unexpected weight change.  HENT:   Negative for lump/mass and nosebleeds.   Eyes:  Negative for eye problems.  Respiratory:  Positive for shortness of breath (with exertion). Negative for cough and hemoptysis.   Cardiovascular:  Negative for chest pain, leg swelling and palpitations.  Gastrointestinal:  Positive for constipation. Negative for abdominal pain, blood in stool, diarrhea, nausea and vomiting.  Genitourinary:  Positive for frequency. Negative for hematuria.   Skin: Negative.   Neurological:  Positive for light-headedness. Negative for dizziness and headaches.  Hematological:  Does not bruise/bleed easily.  Psychiatric/Behavioral:  The patient is nervous/anxious.      PHYSICAL EXAM:  ECOG PERFORMANCE STATUS: 1 - Symptomatic but completely ambulatory  There were no vitals filed for this visit. There were no vitals filed for this visit. Physical Exam Constitutional:      Appearance: Normal appearance.  HENT:     Head: Normocephalic and atraumatic.     Mouth/Throat:     Mouth: Mucous membranes are moist.  Eyes:      Extraocular Movements: Extraocular movements intact.     Pupils: Pupils are equal, round, and reactive to light.  Cardiovascular:     Rate and Rhythm: Normal rate and regular rhythm.     Pulses: Normal pulses.     Heart sounds: Normal heart sounds.  Pulmonary:     Effort: Pulmonary effort is normal.     Breath sounds: Normal breath sounds.  Abdominal:     General: Bowel sounds are normal.     Palpations: Abdomen is soft.     Tenderness: There is no abdominal tenderness.  Musculoskeletal:        General: No swelling.     Right lower leg: Edema (trace ankle edema) present.     Left lower leg: No edema.  Lymphadenopathy:     Cervical: No cervical adenopathy.  Skin:    General: Skin is warm and dry.     Comments: Varicose veins over bilateral lower extremities, right greater than left  Neurological:     General: No focal deficit present.     Mental Status: She is alert and oriented to person, place, and time.  Psychiatric:        Mood and Affect: Mood normal.        Behavior: Behavior normal.  PAST MEDICAL/SURGICAL HISTORY:  Past Medical History:  Diagnosis Date   COPD (chronic obstructive pulmonary disease) ([place])    Past Surgical History:  Procedure Laterality Date   ABDOMINAL HYSTERECTOMY     TONSILLECTOMY      SOCIAL HISTORY:  Social History   Socioeconomic History   Marital status: Widowed    Spouse name: Not on file   Number of children: Not on file   Years of education: Not on file   Highest education level: Not on file  Occupational History   Not on file  Tobacco Use   Smoking status: Former    Packs/day: 0.50    Types: Cigarettes    Start date: 09/13/1966    Quit date: 03/13/2021    Years since quitting: 1.5   Smokeless tobacco: Never  Substance and Sexual Activity   Alcohol use: Never   Drug use: Never   Sexual activity: Not Currently  Other Topics Concern   Not on file  Social History Narrative   Not on file   Social Determinants of Health    Financial Resource Strain: Not on file  Food Insecurity: Not on file  Transportation Needs: Not on file  Physical Activity: Not on file  Stress: Not on file  Social Connections: Not on file  Intimate Partner Violence: Not on file    FAMILY HISTORY:  Family History  Problem Relation Age of Onset   Asthma Maternal Grandmother    Diabetes Maternal Grandmother    Asthma Paternal Grandmother    Alpha-1 antitrypsin deficiency Neg Hx    Tuberous sclerosis Neg Hx    Lung disease Neg Hx     CURRENT MEDICATIONS:  Outpatient Encounter Medications as of 10/14/2022  Medication Sig   Acetaminophen (TYLENOL) 325 MG CAPS    ELIQUIS 5 MG TABS tablet Take 5 mg by mouth 2 (two) times daily.   NON FORMULARY Take 2 tablets by mouth 2 (two) times daily. Takes Vit D, Zinc and Calcium together.   No facility-administered encounter medications on file as of 10/14/2022.    ALLERGIES:  Allergies  Allergen Reactions   Ivp Dye [Iodinated Contrast Media] Hives    Patient reports that she had rash, hives and breaks out all over.     Codeine Nausea Only   Nitrofurantoin Other (See Comments)    Other reaction(s): Rapid heartbeat    LABORATORY DATA:  I have reviewed the labs as listed.  CBC    Component Value Date/Time   WBC 12.4 (H) 10/07/2022 1028   RBC 4.15 10/07/2022 1028   HGB 13.0 10/07/2022 1028   HCT 40.1 10/07/2022 1028   PLT 350 10/07/2022 1028   MCV 96.6 10/07/2022 1028   MCH 31.3 10/07/2022 1028   MCHC 32.4 10/07/2022 1028   RDW 13.0 10/07/2022 1028   LYMPHSABS 2.4 10/07/2022 1028   MONOABS 1.3 (H) 10/07/2022 1028   EOSABS 0.0 10/07/2022 1028   BASOSABS 0.1 10/07/2022 1028      Latest Ref Rng & Units 11/24/2021    5:25 PM  CMP  Creatinine 0.44 - 1.00 mg/dL 0.90     DIAGNOSTIC IMAGING:  I have independently reviewed the relevant imaging and discussed with the patient.   WRAP UP:  All questions were answered. The patient knows to call the clinic with any problems,  questions or concerns.  Medical decision making: Moderate  Time spent on visit: I spent 20 minutes counseling the patient face to face. The total time spent in the appointment was  30 minutes and more than 50% was on counseling.  Carnella Guadalajara, PA-C  10/14/22 12:10 PM

## 2022-10-14 ENCOUNTER — Inpatient Hospital Stay: Payer: Medicare Other | Attending: Physician Assistant | Admitting: Physician Assistant

## 2022-10-14 VITALS — BP 167/83 | HR 71 | Temp 98.3°F | Resp 18 | Ht 65.0 in | Wt 164.0 lb

## 2022-10-14 DIAGNOSIS — Z87440 Personal history of urinary (tract) infections: Secondary | ICD-10-CM | POA: Insufficient documentation

## 2022-10-14 DIAGNOSIS — I2699 Other pulmonary embolism without acute cor pulmonale: Secondary | ICD-10-CM

## 2022-10-14 DIAGNOSIS — Z7901 Long term (current) use of anticoagulants: Secondary | ICD-10-CM | POA: Diagnosis not present

## 2022-10-14 DIAGNOSIS — D72829 Elevated white blood cell count, unspecified: Secondary | ICD-10-CM | POA: Insufficient documentation

## 2022-10-14 NOTE — Patient Instructions (Addendum)
Rachael Mullins at [facility] Discharge Instructions  You were seen today by Tarri Abernethy PA-C for your history of blood clots in your lungs.  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  Seek IMMEDIATE medical attention if you notice any symptoms of recurrent blood clots (new swelling in 1 leg more than the other, shortness of breath, chest pain, coughing up blood, racing heartbeat).  Seek IMMEDIATE medical attention if you have any major bleeding episodes while taking blood thinner.  Your blood pressure was elevated during your visit today, which may be why you have been feeling "fuzzy headed" the last few days.  Please call your primary care doctor for further instructions regarding your blood pressure.  I also recommend that you purchase a home blood pressure machine so that you can check your blood pressure every morning and evening at home.  Seek IMMEDIATE medical attention if you have any symptoms of stroke or any new chest pain or difficulty breathing.  Your white blood cells were mildly elevated on your most recent labs.  Will recheck your labs in 1 month.  If you have continued elevation in your white blood cells, we may need to check additional labs at that time.  As long as your white blood cells have returned to normal, you will not need to see [place] again until next year.   ** Thank you for trusting me with your healthcare!  I strive to provide all of my patients with quality care at each visit.  If you receive a survey for this visit, I would be so grateful to you for taking the time to provide feedback.  Thank you in advance!  ~ Jaidalyn Schillo                   Dr. Derek Jack   &   Tarri Abernethy, PA-C   - - - - - - - - - - - - - - - - - -    Thank you for choosing [place] at [facility] to provide your oncology and hematology care.  To afford  each patient quality time with our provider, please arrive at least 15 minutes before your scheduled appointment time.   If you have a lab appointment with the [place] please come in thru the Main Entrance and check in at the main information desk.  You need to re-schedule your appointment should you arrive 10 or more minutes late.  We strive to give you quality time with our providers, and arriving late affects you and other patients whose appointments are after yours.  Also, if you no show three or more times for appointments you may be dismissed from the clinic at the providers discretion.     Again, thank you for choosing [facility].  Our hope is that these requests will decrease the amount of time that you wait before being seen by our physicians.       _____________________________________________________________  Should you have questions after your visit to [facility], please contact our office at [phone] and follow the prompts.  Our office hours are 8:00 a.m. and 4:30 p.m. Monday - Friday.  Please note that voicemails left after 4:00 p.m. may not be returned until the following business day.  We are closed weekends and major holidays.  You do have access to a nurse  24-7, just call the main number to the clinic [phone] and do not press any options, hold on the line and a nurse will answer the phone.    For prescription refill requests, have your pharmacy contact our office and allow 72 hours.    Due to Covid, you will need to wear a mask upon entering the hospital. If you do not have a mask, a mask will be given to you at the Main Entrance upon arrival. For doctor visits, patients may have 1 support person age 8 or older with them. For treatment visits, patients can not have anyone with them due to social distancing guidelines and our immunocompromised population.

## 2022-11-11 ENCOUNTER — Other Ambulatory Visit: Payer: Self-pay

## 2022-11-11 DIAGNOSIS — I2699 Other pulmonary embolism without acute cor pulmonale: Secondary | ICD-10-CM

## 2022-11-12 ENCOUNTER — Inpatient Hospital Stay: Payer: Medicare Other | Attending: Physician Assistant

## 2022-11-12 DIAGNOSIS — I2699 Other pulmonary embolism without acute cor pulmonale: Secondary | ICD-10-CM | POA: Diagnosis present

## 2022-11-12 LAB — CBC WITH DIFFERENTIAL/PLATELET
Abs Immature Granulocytes: 0.04 10*3/uL (ref 0.00–0.07)
Basophils Absolute: 0.1 10*3/uL (ref 0.0–0.1)
Basophils Relative: 1 %
Eosinophils Absolute: 0.1 10*3/uL (ref 0.0–0.5)
Eosinophils Relative: 1 %
HCT: 39.6 % (ref 36.0–46.0)
Hemoglobin: 12.7 g/dL (ref 12.0–15.0)
Immature Granulocytes: 1 %
Lymphocytes Relative: 25 %
Lymphs Abs: 2.2 10*3/uL (ref 0.7–4.0)
MCH: 31.1 pg (ref 26.0–34.0)
MCHC: 32.1 g/dL (ref 30.0–36.0)
MCV: 97.1 fL (ref 80.0–100.0)
Monocytes Absolute: 0.9 10*3/uL (ref 0.1–1.0)
Monocytes Relative: 11 %
Neutro Abs: 5.3 10*3/uL (ref 1.7–7.7)
Neutrophils Relative %: 61 %
Platelets: 313 10*3/uL (ref 150–400)
RBC: 4.08 MIL/uL (ref 3.87–5.11)
RDW: 12.6 % (ref 11.5–15.5)
WBC: 8.6 10*3/uL (ref 4.0–10.5)
nRBC: 0 % (ref 0.0–0.2)

## 2022-11-17 ENCOUNTER — Telehealth: Payer: Self-pay | Admitting: *Deleted

## 2022-11-17 NOTE — Telephone Encounter (Signed)
VM message left for patient regarding CBC results.  WBC have normalized and no further testing is needed and can follow up in 1 year as planned.

## 2023-05-11 IMAGING — CT CT ANGIO CHEST
2 of 7 series · 16 of 36 positions shown · IV contrast (Omnipaque or Isovue)
Comparison: None.

CLINICAL DATA: Shortness of breath with exertion

EXAM:
CT ANGIOGRAPHY CHEST WITH CONTRAST
TECHNIQUE: Multidetector CT imaging of the chest was performed using the
standard protocol during bolus administration of intravenous
contrast. Multiplanar CT image reconstructions and MIPs were
obtained to evaluate the vascular anatomy.

[Series 5: pe axial thins · axial · 0.68mm/px · z∈[+1339,+1616]mm · 15 of 396 slices shown]
[im 25/396  lung]
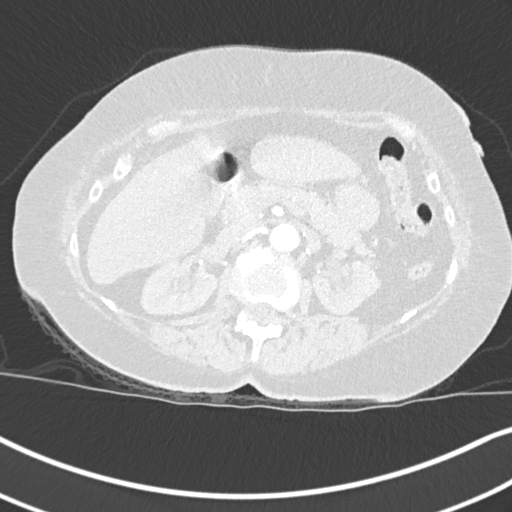
[im 50/396  mediastinal]
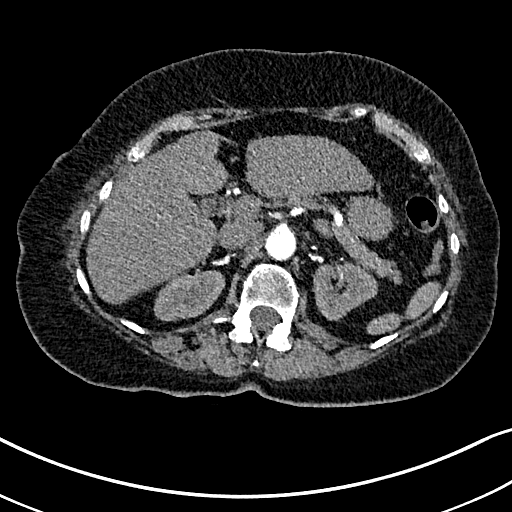
[im 75/396  lung]
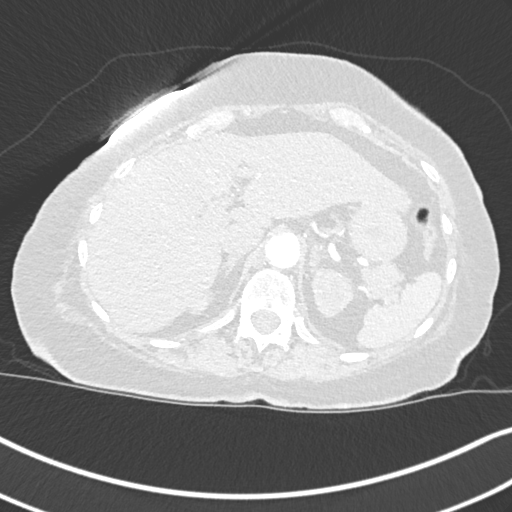
[im 99/396  mediastinal]
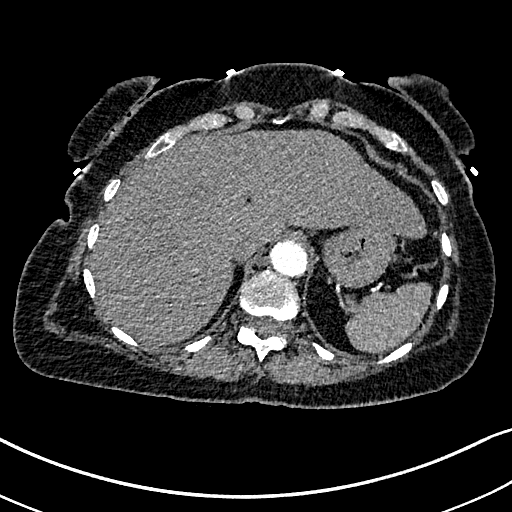
[im 124/396  lung]
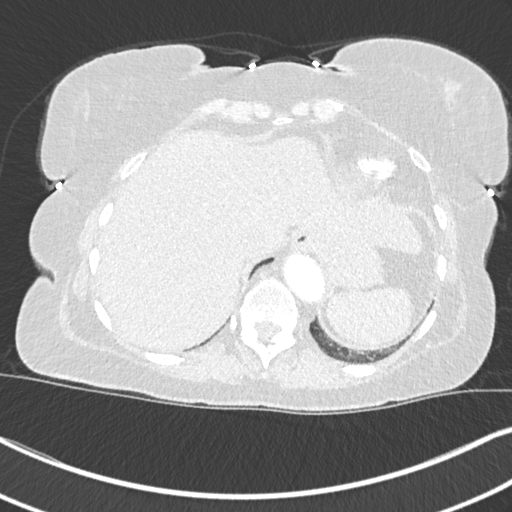
[im 149/396  mediastinal]
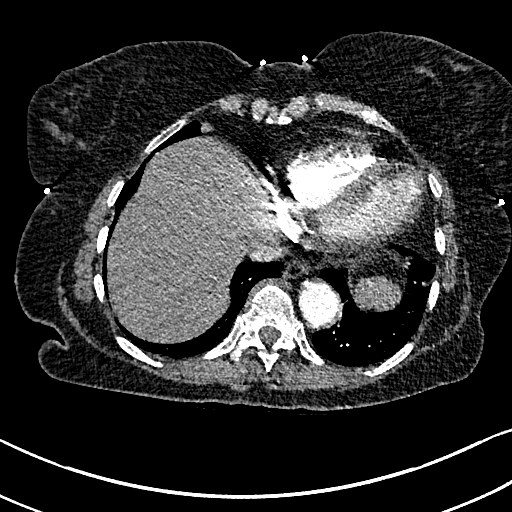
[im 173/396  lung]
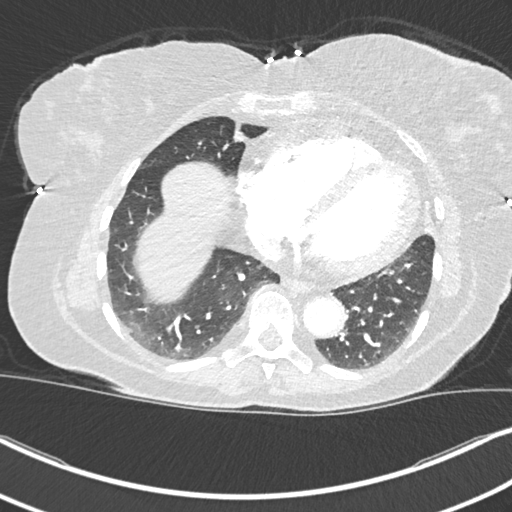
[im 198/396  mediastinal]
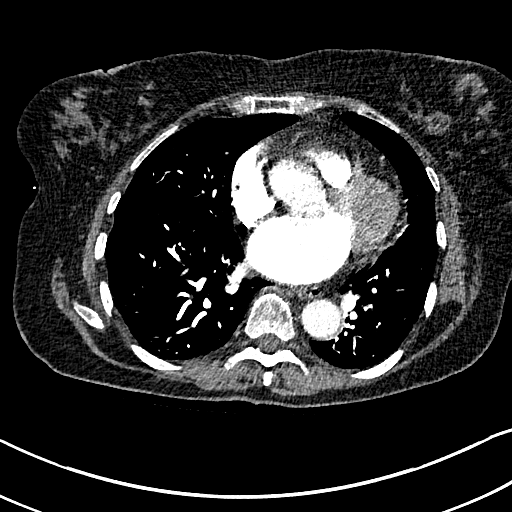
[im 223/396  lung]
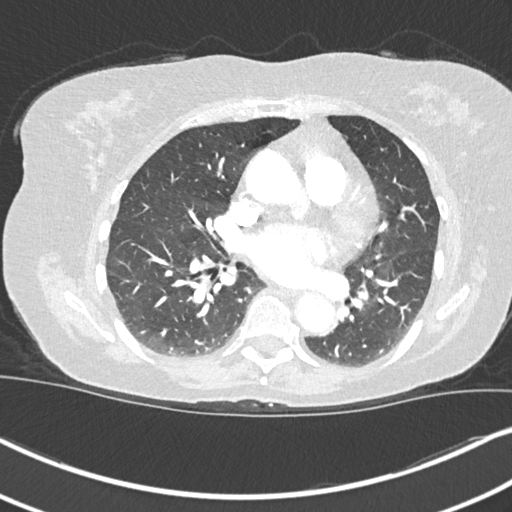
[im 247/396  mediastinal]
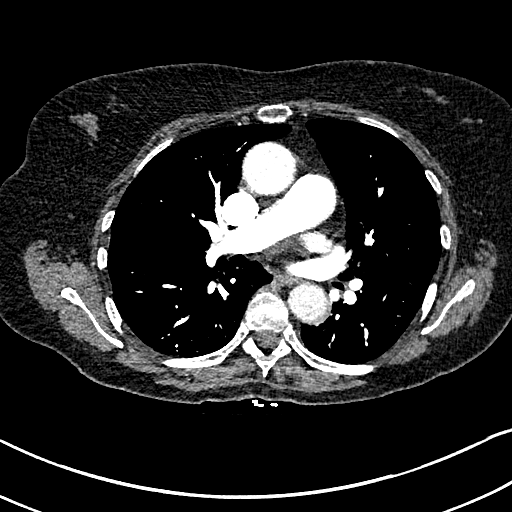
[im 272/396  lung]
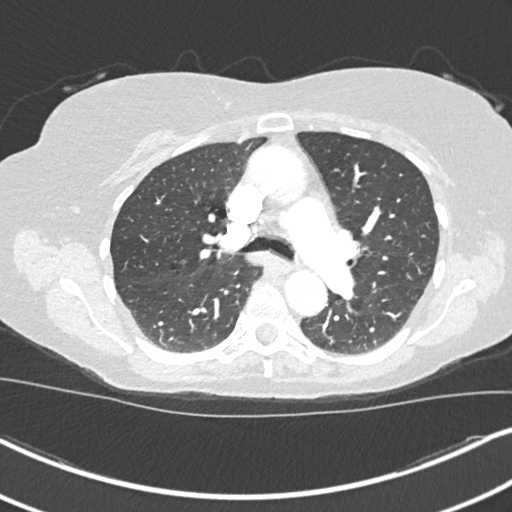
[im 297/396  mediastinal]
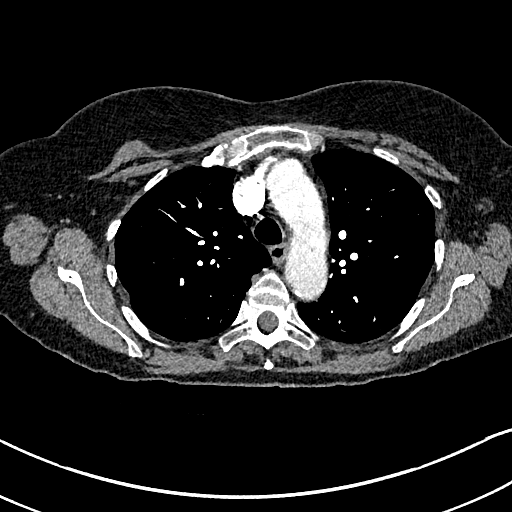
[im 321/396  lung]
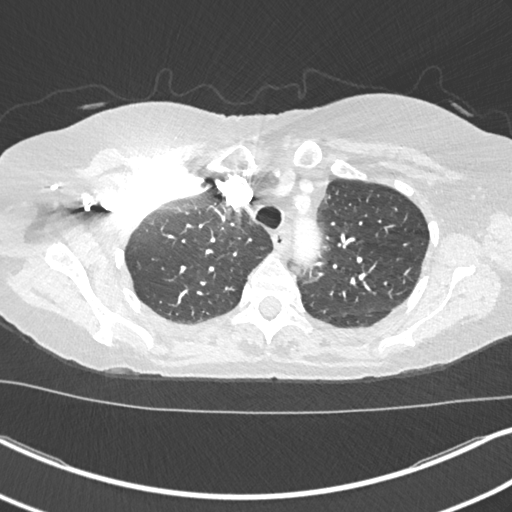
[im 346/396  mediastinal]
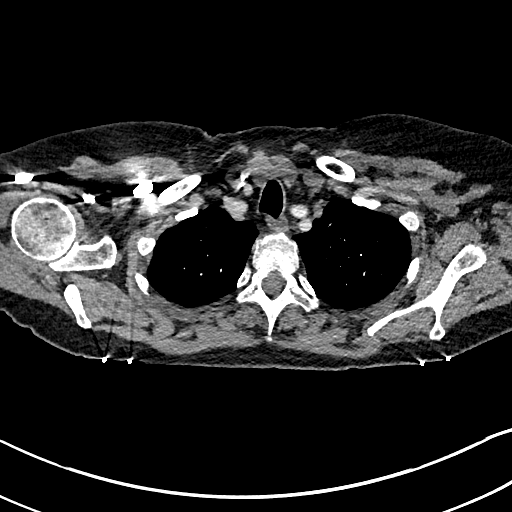
[im 371/396  lung]
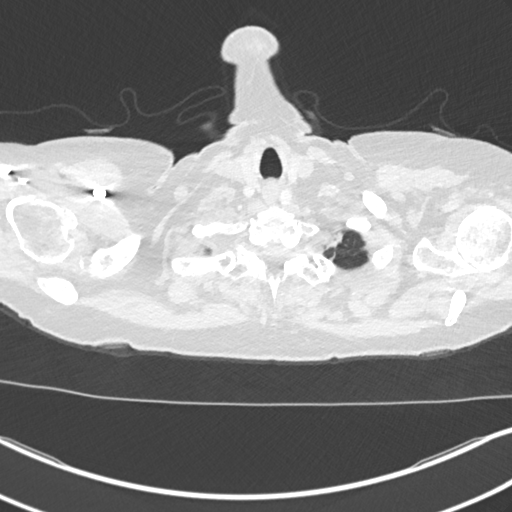

[Series 7: cor soft · coronal · 0.65mm/px · 1 of 134 slices shown]
[im 67/134  mediastinal]
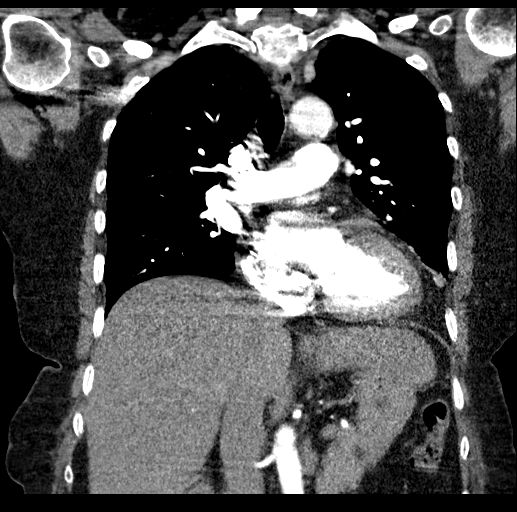

[16 of 36 positions shown; findings below may reference images not displayed]

RADIATION DOSE REDUCTION: This exam was performed according to the
departmental dose-optimization program which includes automated
exposure control, adjustment of the mA and/or kV according to
patient size and/or use of iterative reconstruction technique.

CONTRAST:  75mL OMNIPAQUE IOHEXOL 350 MG/ML SOLN
FINDINGS: Cardiovascular: No filling defects in the pulmonary arteries to
suggest pulmonary emboli. Heart is normal size. Aorta is normal
caliber. Scattered aortic calcifications.

Mediastinum/Nodes: No mediastinal, hilar, or axillary adenopathy.
Trachea and esophagus are unremarkable. Thyroid unremarkable.

Lungs/Pleura: Lungs are clear. No focal airspace opacities or
suspicious nodules. No effusions.

Upper Abdomen: Imaging into the upper abdomen demonstrates no acute
findings.

Musculoskeletal: Chest wall soft tissues are unremarkable. No acute
bony abnormality.

Review of the MIP images confirms the above findings.
IMPRESSION: No evidence of pulmonary embolus.

No acute cardiopulmonary disease.

Aortic Atherosclerosis ([id number]).

## 2023-05-18 ENCOUNTER — Ambulatory Visit: Payer: Medicare Other | Admitting: Internal Medicine

## 2023-06-07 ENCOUNTER — Encounter (INDEPENDENT_AMBULATORY_CARE_PROVIDER_SITE_OTHER): Payer: Self-pay | Admitting: *Deleted

## 2023-07-19 ENCOUNTER — Ambulatory Visit: Payer: Medicare Other | Admitting: Internal Medicine

## 2023-11-10 ENCOUNTER — Other Ambulatory Visit: Payer: Self-pay

## 2023-11-10 DIAGNOSIS — Z7901 Long term (current) use of anticoagulants: Secondary | ICD-10-CM

## 2023-11-10 DIAGNOSIS — I2699 Other pulmonary embolism without acute cor pulmonale: Secondary | ICD-10-CM

## 2023-11-11 ENCOUNTER — Inpatient Hospital Stay: Payer: Medicare Other | Attending: Physician Assistant

## 2023-11-11 DIAGNOSIS — I2699 Other pulmonary embolism without acute cor pulmonale: Secondary | ICD-10-CM | POA: Insufficient documentation

## 2023-11-11 DIAGNOSIS — Z7901 Long term (current) use of anticoagulants: Secondary | ICD-10-CM

## 2023-11-11 LAB — BASIC METABOLIC PANEL
Anion gap: 9 (ref 5–15)
BUN: 19 mg/dL (ref 8–23)
CO2: 24 mmol/L (ref 22–32)
Calcium: 9 mg/dL (ref 8.9–10.3)
Chloride: 104 mmol/L (ref 98–111)
Creatinine, Ser: 0.9 mg/dL (ref 0.44–1.00)
GFR, Estimated: >60 mL/min (ref >60)
Glucose, Bld: 96 mg/dL (ref 70–99)
Potassium: 3.7 mmol/L (ref 3.5–5.1)
Sodium: 137 mmol/L (ref 135–145)

## 2023-11-11 LAB — CBC WITH DIFFERENTIAL/PLATELET
Abs Immature Granulocytes: 0.11 10*3/uL — ABNORMAL HIGH (ref 0.00–0.07)
Basophils Absolute: 0.1 10*3/uL (ref 0.0–0.1)
Basophils Relative: 1 %
Eosinophils Absolute: 0.1 10*3/uL (ref 0.0–0.5)
Eosinophils Relative: 1 %
HCT: 38.5 % (ref 36.0–46.0)
Hemoglobin: 12.3 g/dL (ref 12.0–15.0)
Immature Granulocytes: 1 %
Lymphocytes Relative: 21 %
Lymphs Abs: 2.2 10*3/uL (ref 0.7–4.0)
MCH: 31 pg (ref 26.0–34.0)
MCHC: 31.9 g/dL (ref 30.0–36.0)
MCV: 97 fL (ref 80.0–100.0)
Monocytes Absolute: 1 10*3/uL (ref 0.1–1.0)
Monocytes Relative: 10 %
Neutro Abs: 6.6 10*3/uL (ref 1.7–7.7)
Neutrophils Relative %: 66 %
Platelets: 309 10*3/uL (ref 150–400)
RBC: 3.97 MIL/uL (ref 3.87–5.11)
RDW: 13 % (ref 11.5–15.5)
WBC: 10.1 10*3/uL (ref 4.0–10.5)
nRBC: 0 % (ref 0.0–0.2)

## 2023-11-11 LAB — D-DIMER, QUANTITATIVE: D-Dimer, Quant: 0.46 ug{FEU}/mL (ref 0.00–0.50)

## 2023-11-17 ENCOUNTER — Inpatient Hospital Stay: Payer: Medicare Other | Attending: Physician Assistant | Admitting: Oncology

## 2023-11-17 ENCOUNTER — Encounter: Payer: Self-pay | Admitting: Oncology

## 2023-11-17 VITALS — HR 85 | Temp 98.9°F | Resp 18 | Ht 65.0 in | Wt 156.0 lb

## 2023-11-17 DIAGNOSIS — I2699 Other pulmonary embolism without acute cor pulmonale: Secondary | ICD-10-CM | POA: Insufficient documentation

## 2023-11-17 DIAGNOSIS — Z7901 Long term (current) use of anticoagulants: Secondary | ICD-10-CM | POA: Insufficient documentation

## 2023-11-17 NOTE — Progress Notes (Signed)
 [facility] [address], [place] [address]   CLINIC:  Medical Oncology/Hematology  PCP:  Vanessa Delaplaine, FNP [address] [place] [address] [phone]   REASON FOR VISIT:  Follow-up for unprovoked pulmonary embolism x 2  CURRENT THERAPY: Eliquis  INTERVAL HISTORY:   [name] 73 y.o. female returns for routine follow-up of her recurrent unprovoked pulmonary embolisms, on chronic anticoagulation with Eliquis.  She was last seen by Rojelio Brenner PA-C on 10/14/2022.  At today's visit, she denies any interval hospitalizations, surgeries or changes in her baseline health.  She presents today with her daughter states since she had COVID back in 2023, her energy levels and endurance has slowly declined.  Reports her appetite energy levels are 50%.  Often has to stop walking to catch her breath.  She has been seen by both pulmonology and cardiology.  She has follow-up with both in the neck regional constipation for which she takes a stool softener.  Has easy bruising has chronic underlying she has been taking her Eliquis as prescribed. She has chronic bilateral leg swelling, right slightly greater than left secondary to "leaky veins."  She denies any new unilateral leg swelling, pain, or erythema.  No shortness of breath at rest, chest pain, cough, hemoptysis, or palpitations.  Reports she has fallen within the past 6 months once.  Reports she turned quickly and felt dizzy and toppled over.  Reports she was able to get up onto a chair.  Denies any injury.  Questions whether or not she can take diclofenac and Eliquis at the same time.   ASSESSMENT & PLAN:  1.  Pulmonary embolism, recurrent - Patient was diagnosed with bilateral PEs with right ventricular heart strain on 02/19/2021, thought to be provoked by COVID-19 infection and hospitalization in May 2022 - She took Eliquis for 3 months, and discontinued it in September 2022, but admitted to not taking  her Eliquis as prescribed - for several weeks she only took the medication once a day rather than twice daily. - She was seen by her PCP on 06/30/2021 for DOE, and CTA chest at that time showed redemonstration of bilateral PEs in the same distribution area as original PEs (suspected to be residual PE from suboptimal treatment rather than recurrent PE).  Venous duplex was negative for DVTs.  At that time, she was resumed on Eliquis. - Coagulopathy panel (10/20/2021): NEGATIVE factor V Leiden, PT gene mutation, beta-2 glycoprotein 1 antibodies, and anticardiolipin antibodies.  - CTA chest (11/24/2021) shows no evidence of pulmonary embolus - Eliquis was discontinued as of 11/26/2021.   - Additional coagulopathy panel obtained after discontinuation of Eliquis (01/21/2022) showed essentially normal protein S.  Minimally elevated protein C and Antithrombin III of no clinical significance.  Lupus anticoagulant panel negative. - Patient was discharged from hematology clinic on 02/04/2022 - since she had provoked PE in the setting of COVID-19 with negative hypercoagulable work-up and resolution of PE demonstrated on CTA chest. - Patient had recurrent unprovoked pulmonary embolism in May 2023.  She was admitted to [facility] in [place] on 02/08/2022 after presenting to the ED with complaints of SOB for the past 3 weeks, and was found to have bilateral submassive PE with cor pulmonale and acute hypoxic respiratory failure.  She was placed back on Eliquis at the time of hospital discharge.  2.  Other history - PMH: COPD, venous insufficiency - SOCIAL: Former smoker (0.25 PPD x50 years).  No alcohol or illicit drugs.  She works as a Diplomatic Services operational officer for [employer]. - FAMILY: Unremarkable per patient report.  PLAN: 1. Long term current use of anticoagulant therapy (Primary) - She continues to report dyspnea on exertion, fatigue, and racing heartbeat with exertion - Labs (11/11/23): Hemoglobin normal and unremarkable  differential.  D-dimer is 0.46.   - Unclear etiology of recurrent unprovoked PE.  Some studies have shown persistent procoagulant state in about 30% of patients at 12 to 18 months post COVID infection.  However, data on this is new and emerging, and no clear guidelines have yet been developed. - Since patient has had recurrent PE, with second episode UNPROVOKED, recommend indefinite anticoagulation. - Continue Eliquis at current dose (5 mg twice daily). -Eliquis and diclofenac are not contraindicated per clinical key. - We will continue to follow peripherally on an annual basis to address risk/benefit ratio of continued anticoagulation. - Labs in 1 year (BMP, CBC/D, D-dimer) with OFFICE visit 1-2 days after   PLAN SUMMARY: >> Labs in 1 year (BMP, CBC/D, D-dimer) >> OFFICE visit in 1 year, 1 to 2 days after labs     REVIEW OF SYSTEMS:   Review of Systems  Constitutional:  Positive for fatigue. Negative for appetite change, chills, diaphoresis, fever and unexpected weight change.  HENT:   Negative for lump/mass and nosebleeds.   Eyes:  Negative for eye problems.  Respiratory:  Positive for shortness of breath (with exertion). Negative for cough and hemoptysis.   Cardiovascular:  Negative for chest pain, leg swelling and palpitations.  Gastrointestinal:  Positive for constipation. Negative for abdominal pain, blood in stool, diarrhea, nausea and vomiting.  Genitourinary:  Positive for frequency. Negative for hematuria.   Skin: Negative.   Neurological:  Positive for light-headedness. Negative for dizziness and headaches.  Hematological:  Does not bruise/bleed easily.  Psychiatric/Behavioral:  The patient is nervous/anxious.      PHYSICAL EXAM:  ECOG PERFORMANCE STATUS: 1 - Symptomatic but completely ambulatory  Vitals:   11/17/23 1402  Pulse: 85  Resp: 18  Temp: 98.9 F (37.2 C)  SpO2: 100%   Filed Weights   11/17/23 1402  Weight: 156 lb (70.8 kg)   Physical  Exam Constitutional:      Appearance: Normal appearance.  HENT:     Head: Normocephalic and atraumatic.  Eyes:     Pupils: Pupils are equal, round, and reactive to light.  Cardiovascular:     Rate and Rhythm: Normal rate and regular rhythm.     Heart sounds: Normal heart sounds. No murmur heard. Pulmonary:     Effort: Pulmonary effort is normal.     Breath sounds: Normal breath sounds. No wheezing.  Abdominal:     General: Bowel sounds are normal. There is no distension.     Palpations: Abdomen is soft.     Tenderness: There is no abdominal tenderness.  Musculoskeletal:        General: Normal range of motion.     Cervical back: Normal range of motion.     Right lower leg: Edema present.     Left lower leg: Edema present.  Skin:    General: Skin is warm and dry.     Findings: No rash.  Neurological:     Mental Status: She is alert and oriented to person, place, and time.  Psychiatric:        Judgment: Judgment normal.     PAST MEDICAL/SURGICAL HISTORY:  Past Medical History:  Diagnosis Date   COPD (chronic obstructive pulmonary disease) (HCC)  Past Surgical History:  Procedure Laterality Date   ABDOMINAL HYSTERECTOMY     TONSILLECTOMY      SOCIAL HISTORY:  Social History   Socioeconomic History   Marital status: Widowed    Spouse name: Not on file   Number of children: Not on file   Years of education: Not on file   Highest education level: Not on file  Occupational History   Not on file  Tobacco Use   Smoking status: Former    Current packs/day: 0.00    Average packs/day: 0.5 packs/day for 54.5 years (27.2 ttl pk-yrs)    Types: Cigarettes    Start date: 09/13/1966    Quit date: 03/13/2021    Years since quitting: 2.6   Smokeless tobacco: Never  Substance and Sexual Activity   Alcohol use: Never   Drug use: Never   Sexual activity: Not Currently  Other Topics Concern   Not on file  Social History Narrative   Not on file   Social Drivers of Health    Financial Resource Strain: Not on file  Food Insecurity: Not on file  Transportation Needs: Not on file  Physical Activity: Not on file  Stress: Not on file  Social Connections: Not on file  Intimate Partner Violence: Not on file    FAMILY HISTORY:  Family History  Problem Relation Age of Onset   Asthma Maternal Grandmother    Diabetes Maternal Grandmother    Asthma Paternal Grandmother    Alpha-1 antitrypsin deficiency Neg Hx    Tuberous sclerosis Neg Hx    Lung disease Neg Hx     CURRENT MEDICATIONS:  Outpatient Encounter Medications as of 11/17/2023  Medication Sig Note   atorvastatin (LIPITOR) 10 MG tablet Take 10 mg by mouth daily.    diclofenac (VOLTAREN) 50 MG EC tablet TAKE ONE TABLET BY MOUTH TWO TIMES A DAY WITH FOOD 11/17/2023: Taking one a day   ELIQUIS 5 MG TABS tablet Take 5 mg by mouth 2 (two) times daily.    furosemide (LASIX) 40 MG tablet Take 40 mg by mouth daily.    lactobacillus acidophilus (BACID) TABS tablet Take 1 tablet by mouth daily.    losartan (COZAAR) 25 MG tablet Take 25 mg by mouth daily.    NON FORMULARY Take 2 tablets by mouth 2 (two) times daily. Takes Vit D, Zinc and Calcium together.    Cholecalciferol 125 MCG (5000 UT) TABS Take 1 tablet every day by oral route.    Zinc Acetate 50 MG CAPS 1 tab once daily    No facility-administered encounter medications on file as of 11/17/2023.    ALLERGIES:  Allergies  Allergen Reactions   Ivp Dye [Iodinated Contrast Media] Hives    Patient reports that she had rash, hives and breaks out all over.     Codeine Nausea Only   Nitrofurantoin Other (See Comments)    Other reaction(s): Rapid heartbeat    LABORATORY DATA:  I have reviewed the labs as listed.  CBC    Component Value Date/Time   WBC 10.1 11/11/2023 1205   RBC 3.97 11/11/2023 1205   HGB 12.3 11/11/2023 1205   HCT 38.5 11/11/2023 1205   PLT 309 11/11/2023 1205   MCV 97.0 11/11/2023 1205   MCH 31.0 11/11/2023 1205   MCHC 31.9  11/11/2023 1205   RDW 13.0 11/11/2023 1205   LYMPHSABS 2.2 11/11/2023 1205   MONOABS 1.0 11/11/2023 1205   EOSABS 0.1 11/11/2023 1205   BASOSABS 0.1 11/11/2023  1205      Latest Ref Rng & Units 11/11/2023   12:05 PM 11/24/2021    5:25 PM  CMP  Glucose 70 - 99 mg/dL 96    BUN 8 - 23 mg/dL 19    Creatinine 1.61 - 1.00 mg/dL 0.96  0.45   Sodium 409 - 145 mmol/L 137    Potassium 3.5 - 5.1 mmol/L 3.7    Chloride 98 - 111 mmol/L 104    CO2 22 - 32 mmol/L 24    Calcium 8.9 - 10.3 mg/dL 9.0      DIAGNOSTIC IMAGING:  I have independently reviewed the relevant imaging and discussed with the patient.   WRAP UP:  All questions were answered. The patient knows to call the clinic with any problems, questions or concerns.  Medical decision making: Moderate  Time spent on visit: I spent 25 minutes counseling the patient face to face. The total time spent in the appointment was 30 minutes and more than 50% was on counseling.  Mauro Kaufmann, NP  11/17/23 2:46 PM

## 2023-11-18 ENCOUNTER — Inpatient Hospital Stay: Payer: Medicare Other | Admitting: Physician Assistant

## 2023-12-15 ENCOUNTER — Telehealth: Payer: Self-pay | Admitting: *Deleted

## 2023-12-15 NOTE — Telephone Encounter (Signed)
 Received a call from PCP office indicating that her B12 has been very elevated >2,000.  Made Rachael Hurt, NP aware and she will complete a telephone visit with her tomorrow to discuss.

## 2023-12-16 ENCOUNTER — Inpatient Hospital Stay: Attending: Physician Assistant | Admitting: Oncology

## 2023-12-16 DIAGNOSIS — R7989 Other specified abnormal findings of blood chemistry: Secondary | ICD-10-CM | POA: Diagnosis not present

## 2023-12-16 NOTE — Progress Notes (Signed)
 Virtual Visit via Telephone Note  I connected with Rachael Mullins on 12/16/23 at 12:45 PM EDT by telephone and verified that I am speaking with the correct person using two identifiers.  Location: Patient: Home Provider: Clinic   I discussed the limitations, risks, security and privacy concerns of performing an evaluation and management service by telephone and the availability of in person appointments. I also discussed with the patient that there may be a patient responsible charge related to this service. The patient expressed understanding and agreed to proceed.   History of Present Illness: Rachael Mullins is a 73 year old female who was last seen in hematology on 11/17/2023 for unprovoked pulmonary embolus x 2 who is currently on Eliquis.  Lab work from 11/11/2023 was unremarkable and D-dimer was 0.46 which is normal.  She was scheduled to see [place] annually.  We were contacted by her PCP stating that her B12 levels are significantly elevated. Went for her 6 month check-up and said her levels.   She is currently not taking B12 supplements.  Reports eating a fairly normal American diet.  States she does have osteoarthritis.    Observations/Objective:Review of Systems  Constitutional:  Positive for malaise/fatigue.  Respiratory:  Positive for shortness of breath.   Musculoskeletal:  Positive for falls and joint pain.  Neurological:  Positive for dizziness.  Psychiatric/Behavioral:  The patient is nervous/anxious.    Physical Exam Neurological:     Mental Status: She is alert and oriented to person, place, and time.    Assessment and Plan: 1. Elevated vitamin B12 level (Primary) -Received a phone call from her PCP that B12 level was elevated.  We discussed in detail redraw of labs through [facility] with repeat B12 level and will add methylmalonic acid serum level.  We discussed that sometimes in inflammation, B12 levels can be elevated as it is an acute phase reactant.  Will call with results of  both labs.  - Methylmalonic acid, serum; Future - Vitamin B12; Future   Follow Up Instructions: Will call with lab results.    I discussed the assessment and treatment plan with the patient. The patient was provided an opportunity to ask questions and all were answered. The patient agreed with the plan and demonstrated an understanding of the instructions.   The patient was advised to call back or seek an in-person evaluation if the symptoms worsen or if the condition fails to improve as anticipated.  I provided 12 minutes of non-face-to-face time during this encounter.   Mauro Kaufmann, NP

## 2023-12-19 ENCOUNTER — Telehealth: Payer: Self-pay | Admitting: *Deleted

## 2023-12-19 NOTE — Telephone Encounter (Signed)
 Patient aware that she needs to get additional labs done at [facility] in [place].  Order requisitions faxed.

## 2024-01-05 NOTE — Addendum Note (Signed)
 Addended by: Sabas Cradle A on: 01/05/2024 11:05 AM   Modules accepted: Orders

## 2024-01-16 ENCOUNTER — Encounter: Payer: Self-pay | Admitting: Oncology

## 2024-11-15 ENCOUNTER — Inpatient Hospital Stay

## 2024-11-22 ENCOUNTER — Inpatient Hospital Stay: Admitting: Oncology
